# Patient Record
Sex: Female | Born: 1960 | Race: White | Hispanic: No | Marital: Married | Smoking: Former smoker
Health system: Southern US, Community
[De-identification: ages and names within clinical notes are randomized; demographics above are authoritative.]

## PROBLEM LIST (undated history)

## (undated) DIAGNOSIS — E669 Obesity, unspecified: Secondary | ICD-10-CM

## (undated) DIAGNOSIS — Z9289 Personal history of other medical treatment: Secondary | ICD-10-CM

## (undated) DIAGNOSIS — L989 Disorder of the skin and subcutaneous tissue, unspecified: Secondary | ICD-10-CM

## (undated) DIAGNOSIS — F419 Anxiety disorder, unspecified: Secondary | ICD-10-CM

## (undated) DIAGNOSIS — R7302 Impaired glucose tolerance (oral): Secondary | ICD-10-CM

## (undated) DIAGNOSIS — F410 Panic disorder [episodic paroxysmal anxiety] without agoraphobia: Secondary | ICD-10-CM

## (undated) DIAGNOSIS — D219 Benign neoplasm of connective and other soft tissue, unspecified: Secondary | ICD-10-CM

## (undated) HISTORY — DX: Obesity, unspecified: E66.9

## (undated) HISTORY — PX: LIPOMA EXCISION: SHX5283

## (undated) HISTORY — DX: Personal history of other medical treatment: Z92.89

## (undated) HISTORY — DX: Panic disorder (episodic paroxysmal anxiety): F41.0

## (undated) HISTORY — PX: CHOLECYSTECTOMY: SHX55

## (undated) HISTORY — PX: TONSILLECTOMY: SUR1361

## (undated) HISTORY — DX: Impaired glucose tolerance (oral): R73.02

## (undated) HISTORY — DX: Benign neoplasm of connective and other soft tissue, unspecified: D21.9

## (undated) HISTORY — DX: Disorder of the skin and subcutaneous tissue, unspecified: L98.9

## (undated) HISTORY — DX: Anxiety disorder, unspecified: F41.9

---

## 2007-07-17 ENCOUNTER — Ambulatory Visit: Payer: Self-pay | Admitting: Family Medicine

## 2008-09-24 ENCOUNTER — Ambulatory Visit: Payer: Self-pay | Admitting: Family Medicine

## 2009-02-24 ENCOUNTER — Ambulatory Visit: Payer: Self-pay | Admitting: Family Medicine

## 2009-09-24 ENCOUNTER — Ambulatory Visit: Payer: Self-pay | Admitting: Gastroenterology

## 2010-02-17 ENCOUNTER — Ambulatory Visit: Payer: Self-pay | Admitting: Family Medicine

## 2010-03-03 ENCOUNTER — Ambulatory Visit: Payer: Self-pay | Admitting: Family Medicine

## 2010-04-12 ENCOUNTER — Ambulatory Visit: Payer: Self-pay | Admitting: Family Medicine

## 2010-04-21 ENCOUNTER — Ambulatory Visit: Payer: Self-pay | Admitting: Otolaryngology

## 2014-01-04 LAB — HM PAP SMEAR

## 2014-01-18 LAB — HM MAMMOGRAPHY

## 2015-06-11 ENCOUNTER — Other Ambulatory Visit: Payer: Self-pay | Admitting: Adult Health

## 2015-06-11 DIAGNOSIS — R0989 Other specified symptoms and signs involving the circulatory and respiratory systems: Secondary | ICD-10-CM

## 2015-06-13 ENCOUNTER — Emergency Department: Payer: Managed Care, Other (non HMO)

## 2015-06-13 ENCOUNTER — Emergency Department
Admission: EM | Admit: 2015-06-13 | Discharge: 2015-06-13 | Disposition: A | Discharge status: Home / Self Care | Payer: Managed Care, Other (non HMO) | Attending: Emergency Medicine | Admitting: Emergency Medicine

## 2015-06-13 ENCOUNTER — Encounter: Payer: Self-pay | Admitting: Emergency Medicine

## 2015-06-13 DIAGNOSIS — F419 Anxiety disorder, unspecified: Secondary | ICD-10-CM

## 2015-06-13 DIAGNOSIS — Z87891 Personal history of nicotine dependence: Secondary | ICD-10-CM | POA: Insufficient documentation

## 2015-06-13 DIAGNOSIS — Z880 Allergy status to penicillin: Secondary | ICD-10-CM | POA: Insufficient documentation

## 2015-06-13 DIAGNOSIS — R202 Paresthesia of skin: Secondary | ICD-10-CM | POA: Diagnosis present

## 2015-06-13 LAB — BASIC METABOLIC PANEL
Anion gap: 6 (ref 5–15)
BUN: 18 mg/dL (ref 6–20)
CALCIUM: 8.8 mg/dL — AB (ref 8.9–10.3)
CO2: 22 mmol/L (ref 22–32)
CREATININE: 0.88 mg/dL (ref 0.44–1.00)
Chloride: 109 mmol/L (ref 101–111)
GFR calc Af Amer: >60 mL/min (ref >60)
GLUCOSE: 103 mg/dL — AB (ref 65–99)
POTASSIUM: 3.6 mmol/L (ref 3.5–5.1)
SODIUM: 137 mmol/L (ref 135–145)

## 2015-06-13 LAB — CBC
HEMATOCRIT: 42.4 % (ref 35.0–47.0)
Hemoglobin: 14.2 g/dL (ref 12.0–16.0)
MCH: 28.8 pg (ref 26.0–34.0)
MCHC: 33.5 g/dL (ref 32.0–36.0)
MCV: 86 fL (ref 80.0–100.0)
PLATELETS: 241 10*3/uL (ref 150–440)
RBC: 4.93 MIL/uL (ref 3.80–5.20)
RDW: 12.9 % (ref 11.5–14.5)
WBC: 7 10*3/uL (ref 3.6–11.0)

## 2015-06-13 LAB — TROPONIN I: Troponin I: <0.03 ng/mL (ref <0.031)

## 2015-06-13 NOTE — ED Provider Notes (Signed)
[facility] Emergency Department Provider Note  ____________________________________________  Time seen: Approximately 2:52 PM  I have reviewed the triage vital signs and the nursing notes.   HISTORY  Chief Complaint Tingling    HPI Shelly Underwood is a 54 y.o. female  patient reports she has panic attacks. Patient reports she's had difficulty doing air into her throatand her throat feels like it's closing this is been going on intermittently for quite some time but only for the last 2 weeks she's had trouble with it. She also says she's having a little bit of funny feeling when she swallows. She is scheduled to have a swallowing study next week. Patient reports she used to smoke as a teenager for several years. She drinks occasionally perhaps 2 or 3 times a week. Patient denies having any fever. She does have a dry cough. She is not short of breath at at all. She does have occasional sharp stabbing chest pains that come last a second or less and then go away. She's had these in various spots in her chest and also in her upper abdomen.  History reviewed. No pertinent past medical history.  There are no active problems to display for this patient.   Past Surgical History  Procedure Laterality Date  . Cholecystectomy    . Tonsillectomy      No current outpatient prescriptions on file.  Allergies Erythromycin and Penicillins  No family history on file.  Social History Social History  Substance Use Topics  . Smoking status: Former Research scientist (life sciences)  . Smokeless tobacco: None  . Alcohol Use: Yes    Review of Systems Constitutional: No fever/chills Eyes: No visual changes. ENT: No sore throat. Cardiovascular: Denies chest pain. Respiratory: Denies shortness of breath. Gastrointestinal: No abdominal pain.  No nausea, no vomiting.  No diarrhea.  No constipation. Genitourinary: Negative for dysuria. Musculoskeletal: Negative for back pain. Skin: Negative for  rash. Neurological: Negative for headaches, focal weakness or numbness.  10-point ROS otherwise negative.  ____________________________________________   PHYSICAL EXAM:  VITAL SIGNS: ED Triage Vitals  Enc Vitals Group     BP 06/13/15 0932 127/71 mmHg     Pulse Rate 06/13/15 0932 68     Resp 06/13/15 0932 20     Temp 06/13/15 0932 97.7 F (36.5 C)     Temp Source 06/13/15 0932 Oral     SpO2 06/13/15 0932 99 %     Weight 06/13/15 0932 208 lb (94.348 kg)     Height 06/13/15 0932 5\' 7"  (1.702 m)     Head Cir --      Peak Flow --      Pain Score 06/13/15 0933 3     Pain Loc --      Pain Edu? --      Excl. in [place]? --     Constitutional: Alert and oriented. Well appearing and in no acute distress. Eyes: Conjunctivae are normal. PERRL. EOMI. Head: Atraumatic. Nose: No congestion/rhinnorhea. Mouth/Throat: Mucous membranes are moist.  Oropharynx non-erythematous. Neck: No stridor. Cardiovascular: Normal rate, regular rhythm. Grossly normal heart sounds.  Good peripheral circulation. Respiratory: Normal respiratory effort.  No retractions. Lungs CTAB. Gastrointestinal: Soft and nontender. No distention. No abdominal bruits. No CVA tenderness. Musculoskeletal: No lower extremity tenderness nor edema.  No joint effusions. Neurologic:  Normal speech and language. No gross focal neurologic deficits are appreciated. No gait instability. Skin:  Skin is warm, dry and intact. No rash noted. Psychiatric: Mood and affect are normal.  Speech and behavior are normal.  ____________________________________________   LABS (all labs ordered are listed, but only abnormal results are displayed)  Labs Reviewed  BASIC METABOLIC PANEL - Abnormal; Notable for the following:    Glucose, Bld 103 (*)    Calcium 8.8 (*)    All other components within normal limits  CBC  TROPONIN I   ____________________________________________  EKG  EKG read and interpreted by me shows normal sinus rhythm rate  of 64 normal axis normal EKG. ____________________________________________  RADIOLOGY  soft tissue of the neck read by radiologist as no acute disease. ____________________________________________   PROCEDURES   ____________________________________________   INITIAL IMPRESSION / ASSESSMENT AND PLAN / ED COURSE  Pertinent labs & imaging results that were available during my care of the patient were reviewed by me and considered in my medical decision making (see chart for details).   ____________________________________________   FINAL CLINICAL IMPRESSION(S) / ED DIAGNOSES  Final diagnoses:  Anxiety      Nena Polio, MD 06/13/15 1538

## 2015-06-13 NOTE — ED Notes (Signed)
Pt reports has had issues with tingling for a while. Reports takes xanax intermittently, took one last pm with no relief. Pt reports sx's worse for the past 2 weeks. Pt reports this am was headed to work and started with severe tingling in her hands and thighs and feels like her throat is going to close.

## 2015-06-13 NOTE — Discharge Instructions (Signed)
Panic Attacks Panic attacks are sudden, short feelings of great fear or discomfort. You may have them for no reason when you are relaxed, when you are uneasy (anxious), or when you are sleeping.  HOME CARE  Take all your medicines as told.  Check with your doctor before starting new medicines.  Keep all doctor visits. GET HELP IF:  You are not able to take your medicines as told.  Your symptoms do not get better.  Your symptoms get worse. GET HELP RIGHT AWAY IF:  Your attacks seem different than your normal attacks.  You have thoughts about hurting yourself or others.  You take panic attack medicine and you have a side effect. MAKE SURE YOU:  Understand these instructions.  Will watch your condition.  Will get help right away if you are not doing well or get worse. Document Released: 10/02/2010 Document Revised: 06/20/2013 Document Reviewed: 04/13/2013 [facility] Patient Information 2015 [place], [place]. This information is not intended to replace advice given to you by your health care provider. Make sure you discuss any questions you have with your health care provider.  Please keep the appointment for the barium swallow. I would also recommend following up with Dr. Aundra Dubin ear nose and throat doctor he can check on your feeling of something in your throat. He can also check on the nasal polyp that showed up on the CAT scan. Please return for any further problems or worsening. Please call [phone] if she feel very short of breath. Please also follow-up with your doctor about the sharp chest pains which are having.

## 2015-06-13 NOTE — ED Notes (Signed)
Pt reports to RN that she has had an extremely high amount of stress in her family lately. Pt also reports to RN that she intermittently gets sharp pains in her center chest. Denies pain currently.

## 2015-06-18 ENCOUNTER — Ambulatory Visit
Admission: RE | Admit: 2015-06-18 | Discharge: 2015-06-18 | Disposition: A | Discharge status: Home / Self Care | Payer: Managed Care, Other (non HMO) | Source: Ambulatory Visit | Attending: Family Medicine | Admitting: Family Medicine

## 2015-06-18 DIAGNOSIS — K449 Diaphragmatic hernia without obstruction or gangrene: Secondary | ICD-10-CM | POA: Insufficient documentation

## 2015-06-18 DIAGNOSIS — F458 Other somatoform disorders: Secondary | ICD-10-CM | POA: Insufficient documentation

## 2015-06-18 DIAGNOSIS — R0989 Other specified symptoms and signs involving the circulatory and respiratory systems: Secondary | ICD-10-CM

## 2016-09-13 DIAGNOSIS — D219 Benign neoplasm of connective and other soft tissue, unspecified: Secondary | ICD-10-CM

## 2016-09-13 HISTORY — DX: Benign neoplasm of connective and other soft tissue, unspecified: D21.9

## 2016-09-25 IMAGING — CT CT HEAD W/O CM
1 series · 15 of 30 positions shown, 19 images · non-contrast
Comparison: Brain MRI April 12, 2010

CLINICAL DATA: Upper extremity tingling for several weeks.
Dysphagia.

EXAM:
CT HEAD WITHOUT CONTRAST
TECHNIQUE: Contiguous axial images were obtained from the base of the skull
through the vertex without intravenous contrast.

[Series 2: head wo · axial · 0.41mm/px · z∈[+39,+187]mm · 15 of 36 slices shown, 19 images]
[im 2/36  brain]
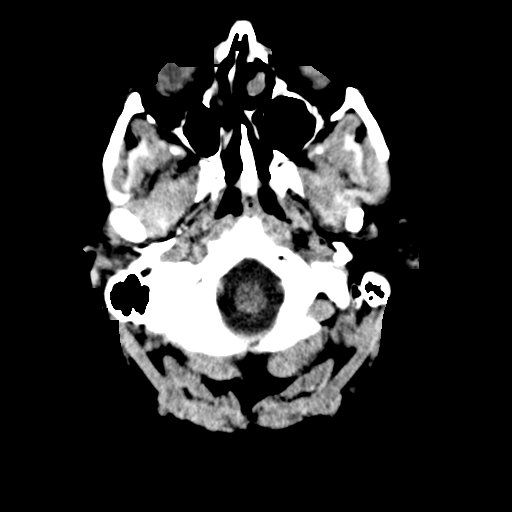
[im 2/36  bone]
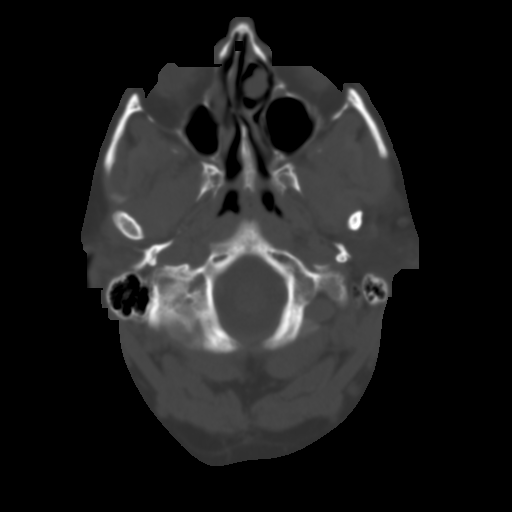
[im 4/36  brain]
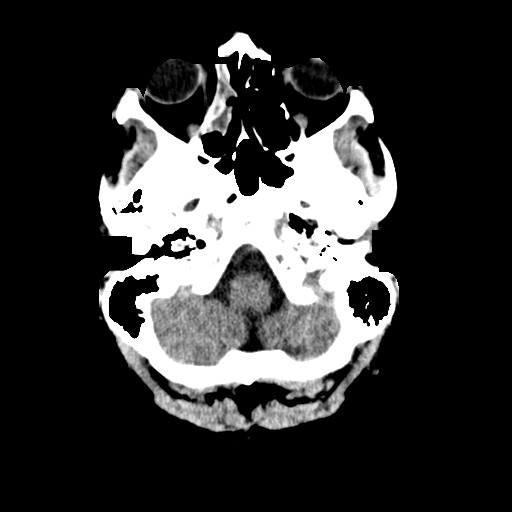
[im 7/36  brain]
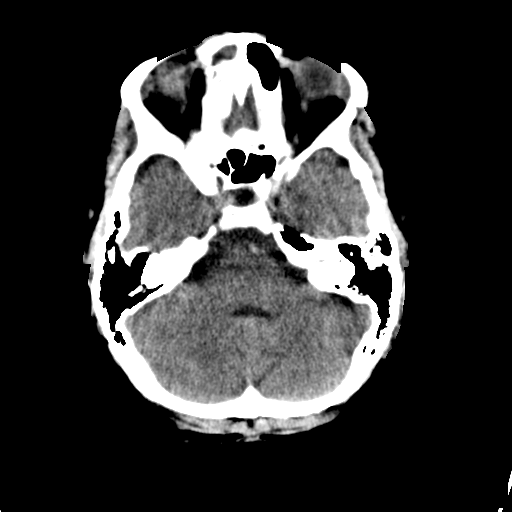
[im 9/36  brain]
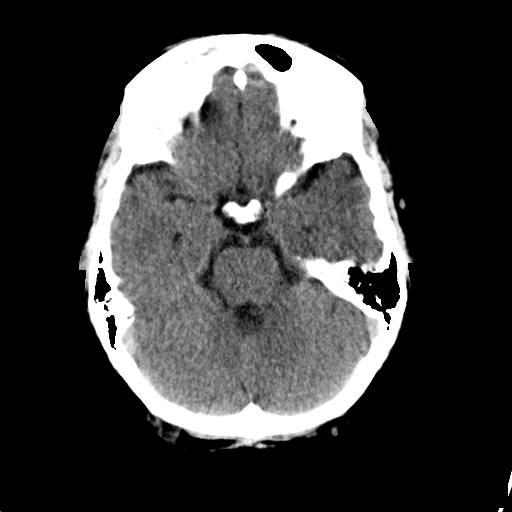
[im 11/36  brain]
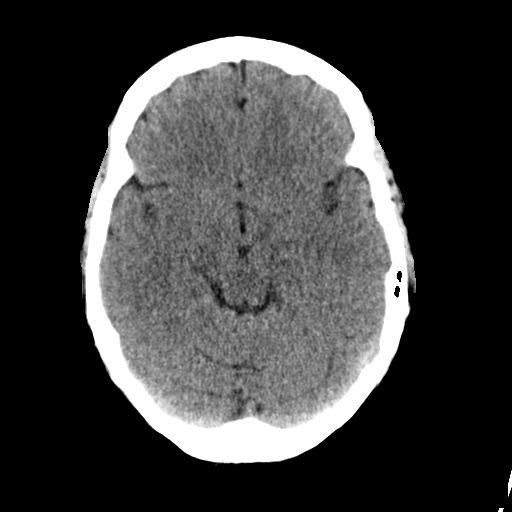
[im 11/36  bone]
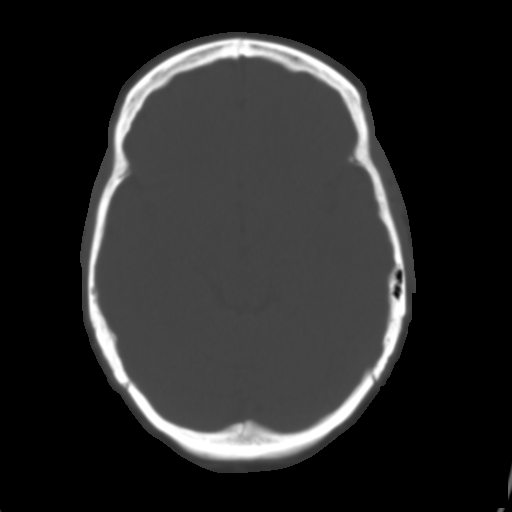
[im 14/36  brain]
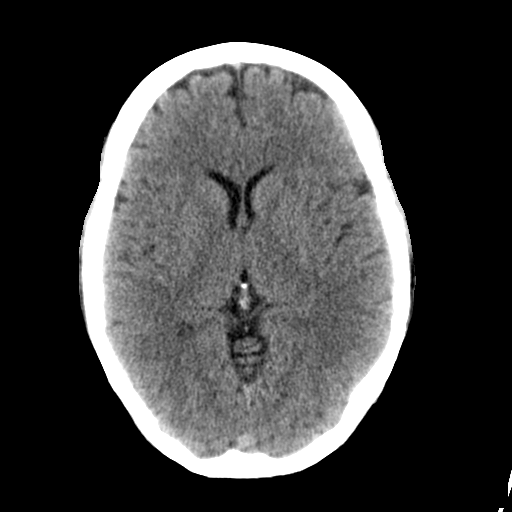
[im 16/36  brain]
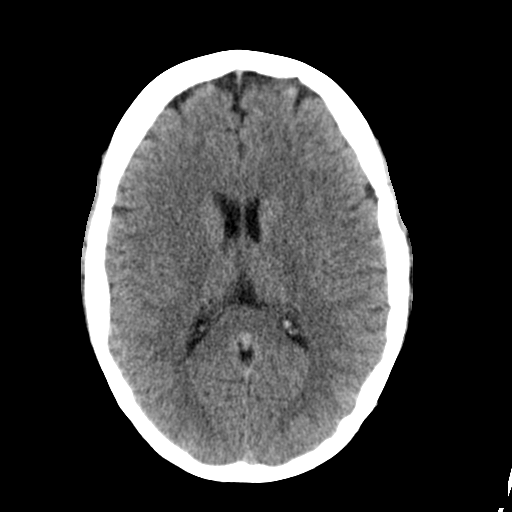
[im 19/36  brain]
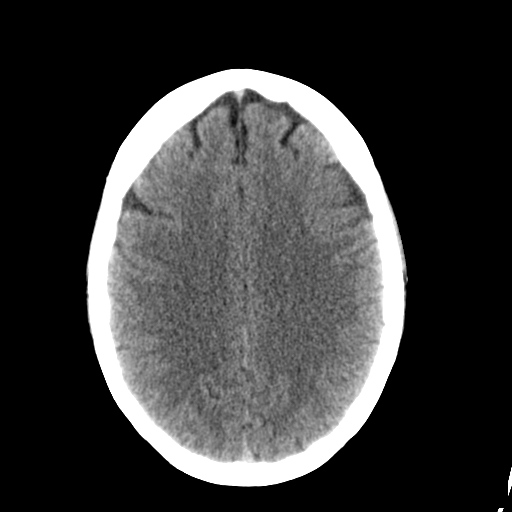
[im 20/36  brain]
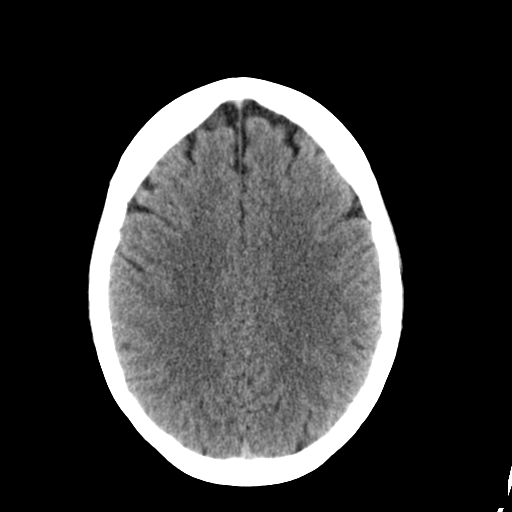
[im 20/36  bone]
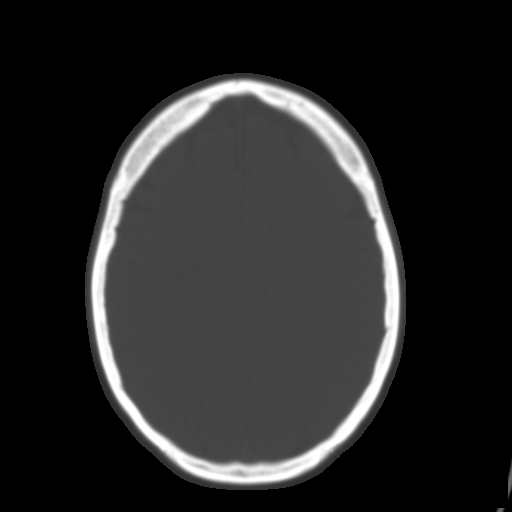
[im 22/36  brain]
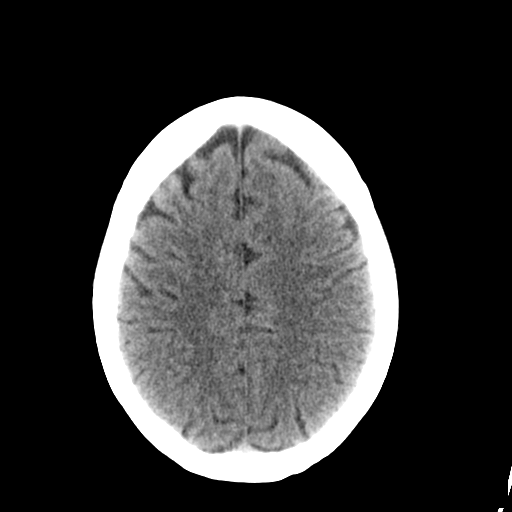
[im 25/36  brain]
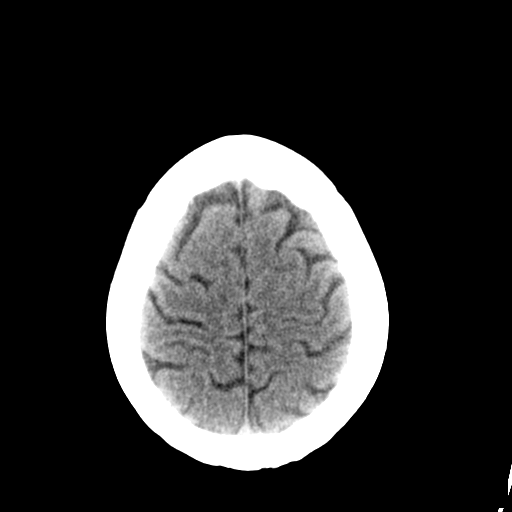
[im 27/36  brain]
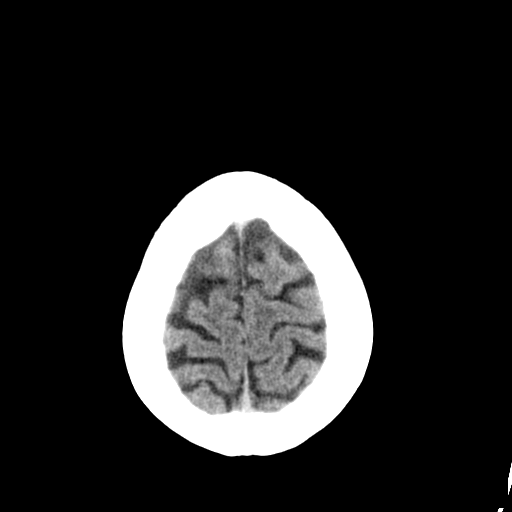
[im 29/36  brain]
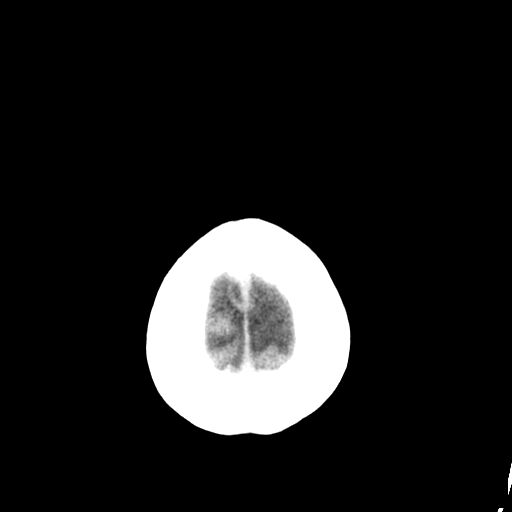
[im 29/36  bone]
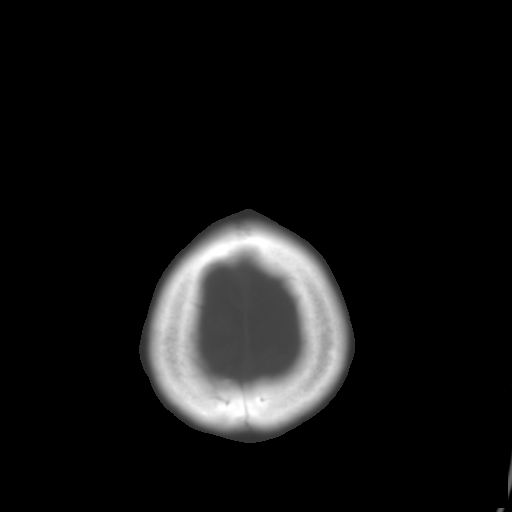
[im 32/36  brain]
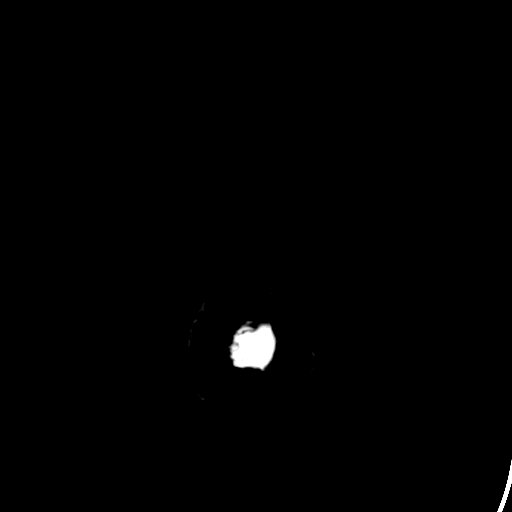
[im 34/36  brain]
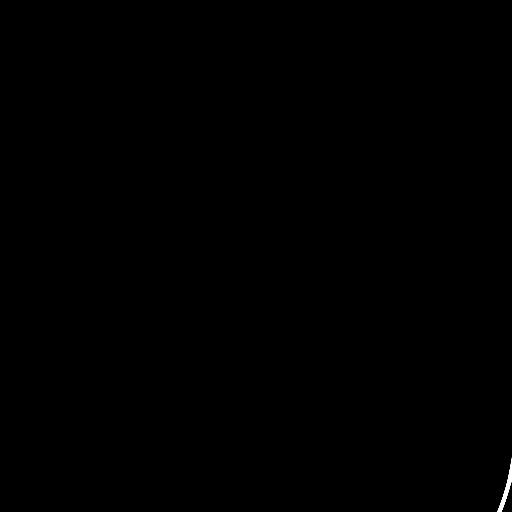

[15 of 30 positions shown; findings below may reference images not displayed]

FINDINGS: The ventricles are normal in size and configuration. There is no
intracranial mass, hemorrhage, extra-axial fluid collection, or
midline shift. Gray-white compartments are normal. There is no acute
infarct evident. The bony calvarium appears intact. The visualized
mastoid air cells are clear. There is opacification of multiple
ethmoid air cells on the right. There is opacification of the right
frontal sinus. There is polypoid formation in a left concha bullosa.
There is rightward deviation of the nasal septum. No intraorbital
lesions are appreciable on this study.
IMPRESSION: No intracranial mass, hemorrhage, or focal gray - white compartment
lesions/acute appearing infarct. Areas of paranasal sinus disease.
Polyp in a concha bullosa on the left. Rightward deviation of nasal
septum.

## 2016-09-25 IMAGING — CR DG NECK SOFT TISSUE
1 series · 2 of 2 positions shown · non-contrast
Comparison: No similar prior exam is available at this institution
for comparison or on [HOSPITAL] PACS.

CLINICAL DATA: Foreign body sensation in the throat

EXAM:
NECK SOFT TISSUES - 1+ VIEW

[Series 1: dg neck soft tissue · 0.14mm/px · 2 of 2 slices shown]
[im 1/2]
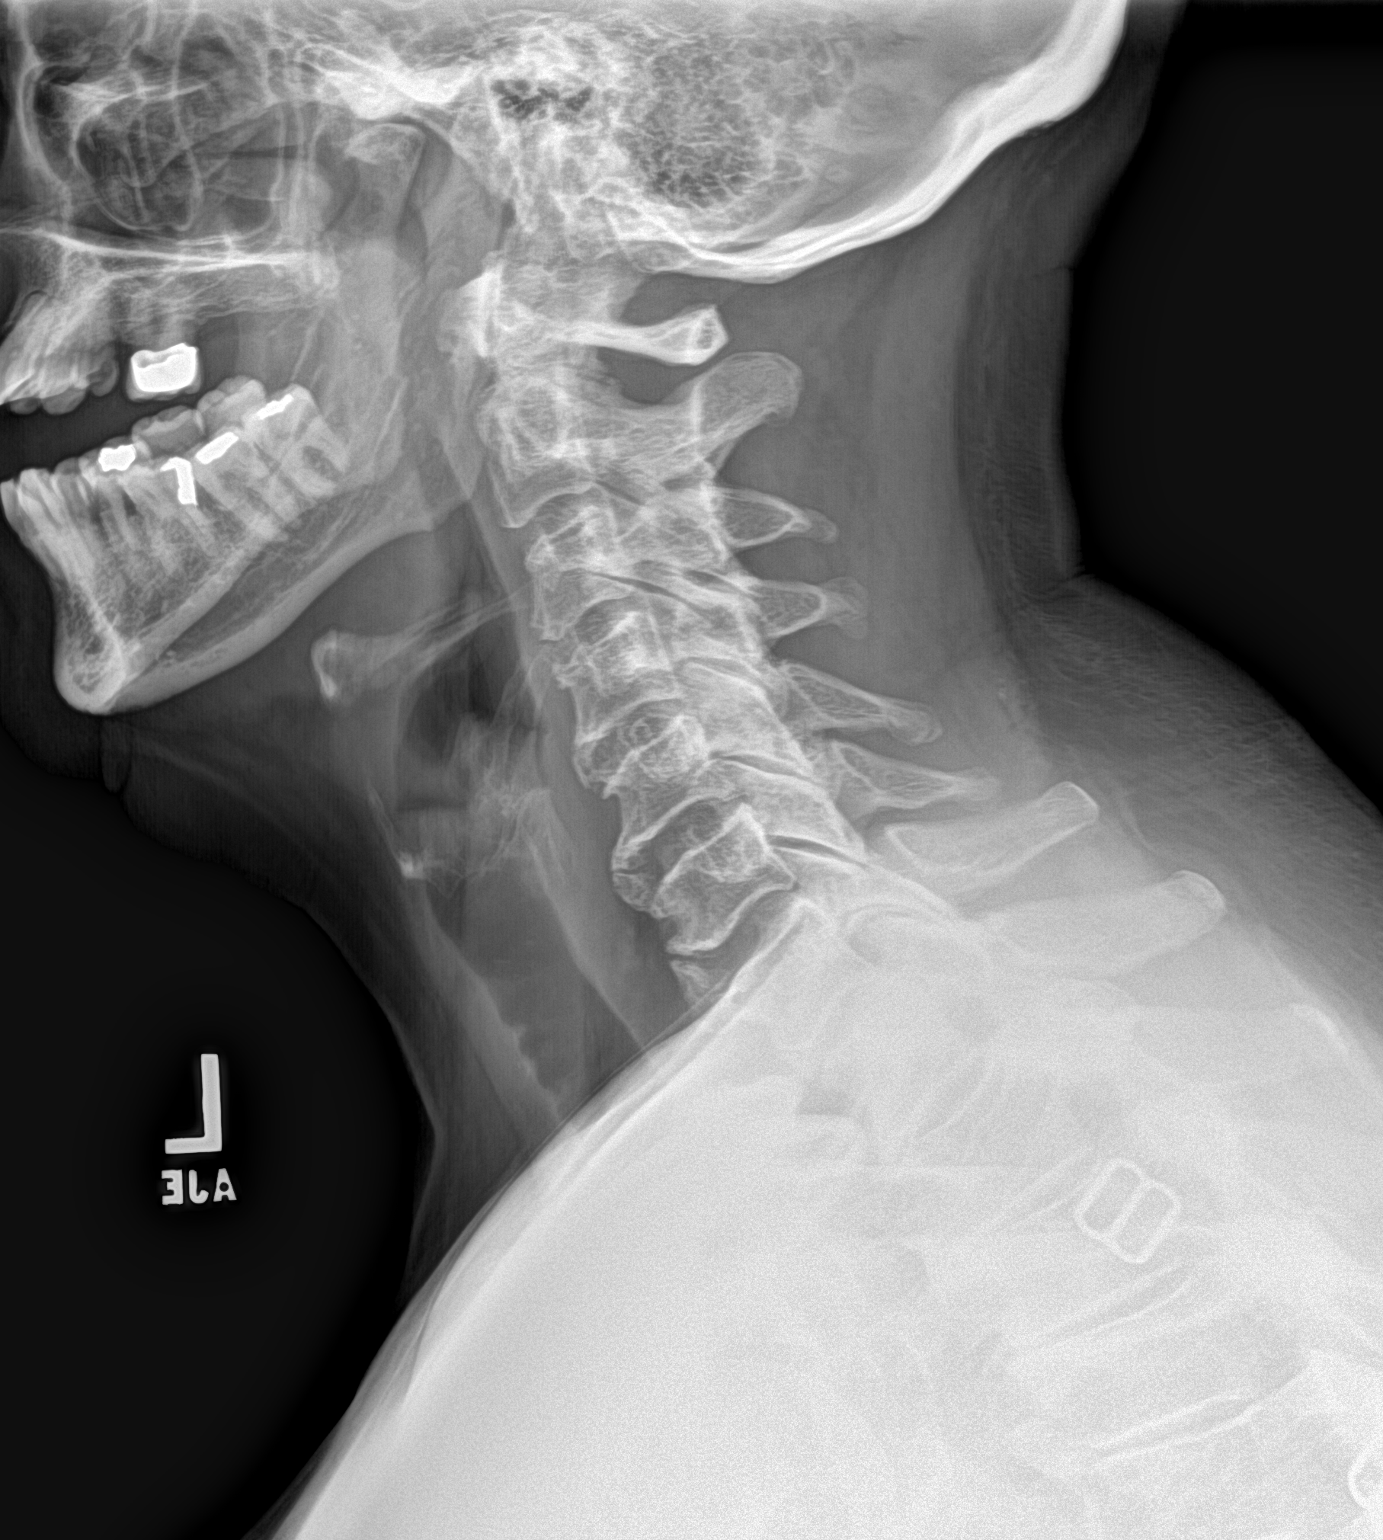
[im 2/2]
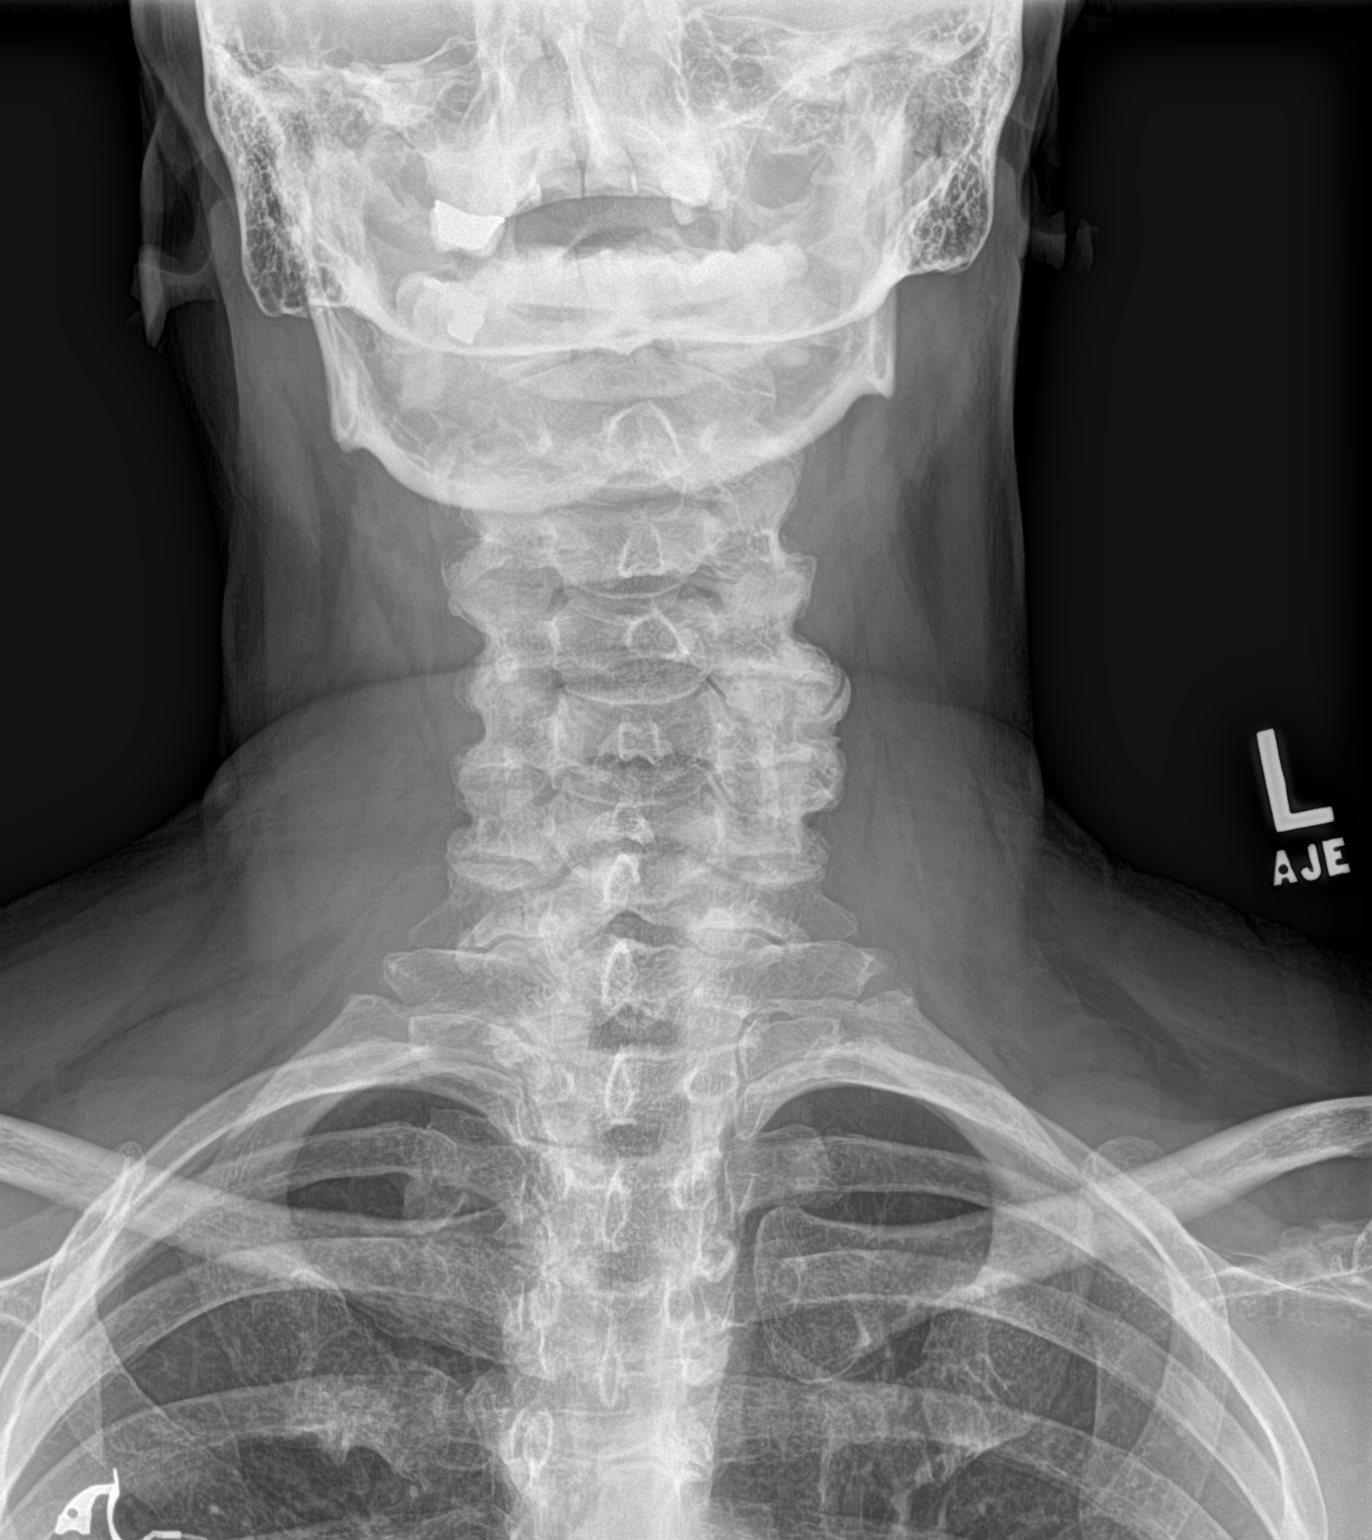

[2 of 2 positions shown; findings below may reference images not displayed]

FINDINGS: There is no evidence of retropharyngeal soft tissue swelling or
epiglottic enlargement. The cervical airway is unremarkable and no
radio-opaque foreign body identified. Multilevel moderate mid left
osteoarthritic facet change noted.
IMPRESSION: Negative.

## 2017-02-22 ENCOUNTER — Ambulatory Visit (INDEPENDENT_AMBULATORY_CARE_PROVIDER_SITE_OTHER): Payer: Managed Care, Other (non HMO) | Admitting: Obstetrics and Gynecology

## 2017-02-22 ENCOUNTER — Encounter: Payer: Self-pay | Admitting: Obstetrics and Gynecology

## 2017-02-22 ENCOUNTER — Ambulatory Visit (INDEPENDENT_AMBULATORY_CARE_PROVIDER_SITE_OTHER): Payer: Managed Care, Other (non HMO)

## 2017-02-22 VITALS — BP 126/74 | Ht 66.0 in | Wt 218.0 lb

## 2017-02-22 DIAGNOSIS — N938 Other specified abnormal uterine and vaginal bleeding: Secondary | ICD-10-CM

## 2017-02-22 DIAGNOSIS — Z124 Encounter for screening for malignant neoplasm of cervix: Secondary | ICD-10-CM

## 2017-02-22 DIAGNOSIS — Z01419 Encounter for gynecological examination (general) (routine) without abnormal findings: Secondary | ICD-10-CM | POA: Diagnosis not present

## 2017-02-22 DIAGNOSIS — Z30431 Encounter for routine checking of intrauterine contraceptive device: Secondary | ICD-10-CM

## 2017-02-22 DIAGNOSIS — T8332XA Displacement of intrauterine contraceptive device, initial encounter: Secondary | ICD-10-CM

## 2017-02-22 DIAGNOSIS — Z1239 Encounter for other screening for malignant neoplasm of breast: Secondary | ICD-10-CM

## 2017-02-22 DIAGNOSIS — Z1231 Encounter for screening mammogram for malignant neoplasm of breast: Secondary | ICD-10-CM | POA: Diagnosis not present

## 2017-02-22 DIAGNOSIS — Z1151 Encounter for screening for human papillomavirus (HPV): Secondary | ICD-10-CM | POA: Diagnosis not present

## 2017-02-22 MED ORDER — MISOPROSTOL 100 MCG PO TABS: ORAL_TABLET | ORAL | 0 refills | Status: DC

## 2017-02-22 NOTE — Progress Notes (Signed)
Chief Complaint  Patient presents with  . Annual Exam    HPI:      [name] is a 56 y.o. No obstetric history on file. who LMP was Patient's last menstrual period was 01/28/2017., presents today for her annual examination.  Her menses are Q1-2 months, lasting 3-4 days. Dysmenorrhea mild, occurring first 1-2 days of flow. She does have intermenstrual bleeding, sometimes dark brown d/c all month, occas real bleeding mid cycle. She has a Mirena IUD, placed 05/01/10. She does not want another IUD after removal.   Sex activity: not sexually active. She does not have vaginal dryness.  Last Pap: January 04, 2014  Results were: no abnormalities /neg HPV DNA.  Hx of STDs: none  Last mammogram: not recent There is a FH of breast cancer in her mat aunt, genetic testing not indicated. There is no FH of ovarian cancer. The patient does not do self-breast exams.  Colonoscopy: colonoscopy 1 years ago without abnormalities.  Tobacco use: The patient denies current or previous tobacco use. Alcohol use: social drinker Exercise: moderately active  She does not get adequate calcium and Vitamin D in her diet. She has a PCP. She is seeing derm for a pruritic rash/hives.  Past Medical History:  Diagnosis Date  . Anxiety   . History of mammogram 08/25/2012; 01/13/14   cat 1; neg  . History of Papanicolaou smear of cervix 03/2010;01/04/2014   -/-; -/-  . Impaired glucose tolerance   . Obesity   . Panic disorder   . Skin disorder     Past Surgical History:  Procedure Laterality Date  . CHOLECYSTECTOMY    . LIPOMA EXCISION    . TONSILLECTOMY      Family History  Problem Relation Age of Onset  . Hyperlipidemia Mother   . Hypertension Maternal Grandmother   . Hypertension Maternal Grandfather   . Breast cancer Maternal Aunt 50    Social History   Social History  . Marital status: Married    Spouse name: N/A  . Number of children: N/A  . Years of education: 60   Occupational  History  . Not on file.   Social History Main Topics  . Smoking status: Former Research scientist (life sciences)  . Smokeless tobacco: Never Used  . Alcohol use Yes     Comment: occ  . Drug use: No  . Sexual activity: Not Currently   Other Topics Concern  . Not on file   Social History Narrative  . No narrative on file     Current Outpatient Prescriptions:  .  ALPRAZolam (XANAX) 0.5 MG tablet, Take by mouth., Disp: , Rfl:  .  hydrOXYzine (ATARAX/VISTARIL) 25 MG tablet, , Disp: , Rfl:  .  triamcinolone cream (KENALOG) 0.1 %, Apply topically., Disp: , Rfl:  .  misoprostol (CYTOTEC) 100 MCG tablet, Take 1 tablet the night before appt, Disp: 1 tablet, Rfl: 0   ROS:  Review of Systems  Constitutional: Negative for fatigue, fever and unexpected weight change.  Respiratory: Negative for cough, shortness of breath and wheezing.   Cardiovascular: Negative for chest pain, palpitations and leg swelling.  Gastrointestinal: Positive for constipation. Negative for blood in stool, diarrhea, nausea and vomiting.  Endocrine: Negative for cold intolerance, heat intolerance and polyuria.  Genitourinary: Negative for dyspareunia, dysuria, flank pain, frequency, genital sores, hematuria, menstrual problem, pelvic pain, urgency, vaginal bleeding, vaginal discharge and vaginal pain.  Musculoskeletal: Positive for arthralgias and joint swelling. Negative for back pain and myalgias.  Skin: Positive for color change and rash.  Neurological: Positive for headaches. Negative for dizziness, syncope, light-headedness and numbness.  Hematological: Negative for adenopathy.  Psychiatric/Behavioral: Negative for agitation, confusion, sleep disturbance and suicidal ideas. The patient is nervous/anxious.      Objective: BP 126/74   Ht 5\' 6"  (1.676 m)   Wt 218 lb (98.9 kg)   LMP 01/28/2017   BMI 35.19 kg/m    Physical Exam  Constitutional: She is oriented to person, place, and time. She appears well-developed and  well-nourished.  Genitourinary: Vagina normal and uterus normal. There is no rash or tenderness on the right labia. There is no rash or tenderness on the left labia. No erythema or tenderness in the vagina. No vaginal discharge found. Right adnexum does not display mass and does not display tenderness. Left adnexum does not display mass and does not display tenderness. Cervix does not exhibit motion tenderness, polyp or visible IUD strings. Uterus is not enlarged or tender.  Genitourinary Comments: IUD STRINGS NOT SEEN/PALPATED  Neck: Normal range of motion. No thyromegaly present.  Cardiovascular: Normal rate, regular rhythm and normal heart sounds.   No murmur heard. Pulmonary/Chest: Effort normal and breath sounds normal. Right breast exhibits no mass, no nipple discharge, no skin change and no tenderness. Left breast exhibits no mass, no nipple discharge, no skin change and no tenderness.  Abdominal: Soft. There is no tenderness. There is no guarding.  Musculoskeletal: Normal range of motion.  Neurological: She is alert and oriented to person, place, and time. No cranial nerve deficit.  Psychiatric: She has a normal mood and affect. Her behavior is normal.  Vitals reviewed.   Results: GYN U/S-->EM=7.1 MM; IUD IN LUS; 2 LEIO--1 SUBMUCOSAL, 1 PEDUNCULATED; RTO NOT VISUALIZED; LTO WITH FOLLICLES  Assessment/Plan:  Encounter for annual routine gynecological examination  Cervical cancer screening - Plan: IGP, Aptima HPV  Screening for HPV (human papillomavirus) - Plan: IGP, Aptima HPV  Screening for breast cancer - Pt to sched mammo. - Plan: MM DIGITAL SCREENING BILATERAL  Encounter for routine checking of intrauterine contraceptive device (IUD) - IUD strings not visible. In LUS per u/s.  Will refer to MD for removal/Cytotec night before due to stenotic os. - Plan: US Transvaginal Non-OB, misoprostol (CYTOTEC) 100 MCG tablet  DUB (dysfunctional uterine bleeding) - Per u/s, IUD in LUS, 2  leio. RTO with MD for IUD removal. Rx cytotec. See if sx improve with IUD removal. If not, will eval further.  - Plan: US Transvaginal Non-OB          GYN counsel mammography screening, menopause     F/U  Return in about 1 week (around 03/01/2017) for with MD for IUD rem.  Alicia B. Copland, PA-C 02/22/2017 5:08 PM

## 2017-02-22 NOTE — Progress Notes (Signed)
Review of ULTRASOUND.    I have personally reviewed images and report of recent ultrasound done at [facility].    Plan of management to be discussed with patient.  As discussed.  Remove IUD and consider options such as hysteroscopy, even hysterectomy.  Barnett Applebaum, MD, [facility], [facility] 02/22/2017  4:19 PM

## 2017-02-24 LAB — IGP, APTIMA HPV
HPV APTIMA: NEGATIVE
PAP Smear Comment: 0

## 2017-03-01 ENCOUNTER — Ambulatory Visit (INDEPENDENT_AMBULATORY_CARE_PROVIDER_SITE_OTHER): Payer: Managed Care, Other (non HMO) | Admitting: Obstetrics & Gynecology

## 2017-03-01 ENCOUNTER — Encounter: Payer: Self-pay | Admitting: Obstetrics & Gynecology

## 2017-03-01 VITALS — BP 138/80 | HR 83 | Ht 67.0 in | Wt 218.0 lb

## 2017-03-01 DIAGNOSIS — R87610 Atypical squamous cells of undetermined significance on cytologic smear of cervix (ASC-US): Secondary | ICD-10-CM | POA: Diagnosis not present

## 2017-03-01 DIAGNOSIS — N938 Other specified abnormal uterine and vaginal bleeding: Secondary | ICD-10-CM | POA: Diagnosis not present

## 2017-03-01 NOTE — Progress Notes (Signed)
  History of Present Illness:  Shelly Underwood is a 56 y.o. that had a Mirena IUD placed approximately 7 years ago. Since that time, she states that she lost track of time for its removal.  Recent change in bleeding pattern (still has monthly one day periods w min pain.)  Occas recent BTB.  Recent US showed IUD in LUS.  No strings on exam.  The following portions of the patient's history were reviewed and updated as appropriate: allergies, current medications, past family history, past medical history, past social history, past surgical history and problem list.  Patient Active Problem List   Diagnosis Date Noted  . Dysfunctional uterine bleeding 03/01/2017  . ASCUS of cervix with negative high risk HPV 03/01/2017   Medications:  Current Outpatient Prescriptions on File Prior to Visit  Medication Sig Dispense Refill  . ALPRAZolam (XANAX) 0.5 MG tablet Take by mouth.    . hydrOXYzine (ATARAX/VISTARIL) 25 MG tablet     . misoprostol (CYTOTEC) 100 MCG tablet Take 1 tablet the night before appt 1 tablet 0  . triamcinolone cream (KENALOG) 0.1 % Apply topically.     No current facility-administered medications on file prior to visit.    Allergies: is allergic to erythromycin and penicillins.  Physical Exam:  BP 138/80   Pulse 83   Ht 5\' 7"  (1.702 m)   Wt 218 lb (98.9 kg)   BMI 34.14 kg/m  Body mass index is 34.14 kg/m. Constitutional: Well nourished, well developed female in no acute distress.  Abdomen: diffusely non tender to palpation, non distended, and no masses, hernias Neuro: Grossly intact Psych:  Normal mood and affect.    Pelvic exam:  Two IUD strings absent seen coming from the cervical os. EGBUS, vaginal vault and cervix: within normal limits  IUD Removal Cervix dilated with mechanical dilators (cytotec last night too).  Strings of IUD identified and grasped from within cervix.  IUD removed without problem.  Pt tolerated this well.  IUD noted to be intact.  Assessment:  IUD Removal and DUB  Plan: IUD removed and plan for contraception is no method. She was amenable to this plan. Monitor for AUB.  Known fibroid that may be submucosal.  Consider Hyst D&C if bleeding problems persist.  EMB if irreg bleeding but does not want surgery.  F/u one month to see.    ASCUS HPV Neg PAP discussed.  F/u PAP one year.  Barnett Applebaum, M.D. 03/01/2017 1:44 PM

## 2017-03-29 ENCOUNTER — Ambulatory Visit (INDEPENDENT_AMBULATORY_CARE_PROVIDER_SITE_OTHER): Payer: Managed Care, Other (non HMO) | Admitting: Obstetrics & Gynecology

## 2017-03-29 ENCOUNTER — Encounter: Payer: Self-pay | Admitting: Obstetrics & Gynecology

## 2017-03-29 VITALS — BP 120/70 | HR 77 | Ht 66.0 in | Wt 219.0 lb

## 2017-03-29 DIAGNOSIS — N938 Other specified abnormal uterine and vaginal bleeding: Secondary | ICD-10-CM

## 2017-03-29 DIAGNOSIS — D250 Submucous leiomyoma of uterus: Secondary | ICD-10-CM

## 2017-03-29 NOTE — Progress Notes (Signed)
  HPI: Pt had irreg bleeding that has improved since IUD of 7 years removed one month ago; also has known small SM Fibroid, and reports min pain from this.  PMHx: She  has a past medical history of Anxiety; History of mammogram (08/25/2012; 01/13/14); History of Papanicolaou smear of cervix (03/2010;01/04/2014); Impaired glucose tolerance; Obesity; Panic disorder; and Skin disorder. Also,  has a past surgical history that includes Cholecystectomy; Tonsillectomy; and Lipoma excision., family history includes Breast cancer (age of onset: 69) in her maternal aunt; Hyperlipidemia in her mother; Hypertension in her maternal grandfather and maternal grandmother.,  reports that she has quit smoking. She has never used smokeless tobacco. She reports that she drinks alcohol. She reports that she does not use drugs.  She has a current medication list which includes the following prescription(s): betamethasone dipropionate, minocycline hcl, alprazolam, hydroxyzine, misoprostol, and triamcinolone cream. Also, is allergic to erythromycin and penicillins.  Review of Systems  All other systems reviewed and are negative.   Objective: BP 120/70   Pulse 77   Ht 5\' 6"  (1.676 m)   Wt 219 lb (99.3 kg)   BMI 35.35 kg/m   Physical examination Constitutional NAD, Conversant  Skin No rashes, lesions or ulceration.   Extremities: Moves all appropriately.  Normal ROM for age. No lymphadenopathy.  Neuro: Grossly intact  Psych: Oriented to PPT.  Normal mood. Normal affect.   Assessment:  Dysfunctional uterine bleeding  Submucous leiomyoma of uterus  Monitor for sx's of bleeding, consider D&C Hyst vs EMB based on sx's moving forward.  A total of 15 minutes were spent face-to-face with the patient during this encounter and over half of that time dealt with counseling and coordination of care.  Barnett Applebaum, MD, [facility], [facility] 03/29/2017  4:40 PM

## 2017-07-27 ENCOUNTER — Encounter: Payer: Self-pay | Admitting: Obstetrics and Gynecology

## 2017-07-27 ENCOUNTER — Ambulatory Visit (INDEPENDENT_AMBULATORY_CARE_PROVIDER_SITE_OTHER): Payer: Managed Care, Other (non HMO) | Admitting: Obstetrics and Gynecology

## 2017-07-27 VITALS — BP 126/88 | Wt 217.0 lb

## 2017-07-27 DIAGNOSIS — N921 Excessive and frequent menstruation with irregular cycle: Secondary | ICD-10-CM | POA: Diagnosis not present

## 2017-07-27 DIAGNOSIS — N951 Menopausal and female climacteric states: Secondary | ICD-10-CM | POA: Diagnosis not present

## 2017-07-27 DIAGNOSIS — F419 Anxiety disorder, unspecified: Secondary | ICD-10-CM

## 2017-07-27 DIAGNOSIS — N938 Other specified abnormal uterine and vaginal bleeding: Secondary | ICD-10-CM | POA: Diagnosis not present

## 2017-07-27 MED ORDER — ALPRAZOLAM 0.5 MG PO TABS: 0.5000 mg | ORAL_TABLET | Freq: Three times a day (TID) | ORAL | 0 refills | Status: DC | PRN

## 2017-07-27 MED ORDER — TRANEXAMIC ACID 650 MG PO TABS: 1300.0000 mg | ORAL_TABLET | Freq: Three times a day (TID) | ORAL | 7 refills | Status: DC

## 2017-07-27 NOTE — Progress Notes (Signed)
Chief Complaint  Patient presents with  . Menstrual Problem    HPI:      [name] is a 56 y.o. [id number] who LMP was Patient's last menstrual period was 07/23/2017., presents today for heavy bleeding sx. Pt had Mirena removed 6/18 that had expired. Pt was having monthly menses with IUD with occas BTB. She also has 2 leio, 1 pedunculated and 1 submucosal on u/s. She was seen by Dr. Kenton Kingfisher 7/18 and he suggested EMB vs hyst/D&C if DUB sx persisted.  Pt states she had a period 8/18 for 2-3 days, heavy flow changing pads Q3 hrs. She was worried about this. Bleeding stopped after 3 days, then recurred 3 wks later. Again, bleeding for 2-3 days with heavier flow. Mild dysmen both times. Pt then had period 07/23/17 that lasted 3-4 days, again heavy but with significant dysmen (worse than previous months), improved somewhat with ibup.  No DUB sx just heavier periods. No hx of migraines with aura/HTN/tobacco use/hx of DVTs. Pt not interested in another IUD.   Pt also with hx of panic attacks/anxiety and takes xanax prn sparingly. In process of finding new PCP since previous one left. Would like Rx RF.   Past Medical History:  Diagnosis Date  . Anxiety   . History of mammogram 08/25/2012; 01/13/14   cat 1; neg  . History of Papanicolaou smear of cervix 03/2010;01/04/2014   -/-; -/-  . Impaired glucose tolerance   . Leiomyoma 2018   submucosal and pedunculated  . Obesity   . Panic disorder   . Skin disorder     Past Surgical History:  Procedure Laterality Date  . CHOLECYSTECTOMY    . LIPOMA EXCISION    . TONSILLECTOMY      Family History  Problem Relation Age of Onset  . Hyperlipidemia Mother   . Hypertension Maternal Grandmother   . Hypertension Maternal Grandfather   . Breast cancer Maternal Aunt 73    Social History   Socioeconomic History  . Marital status: Married    Spouse name: Not on file  . Number of children: Not on file  . Years of education: 89  . Highest  education level: Not on file  Social Needs  . Financial resource strain: Not on file  . Food insecurity - worry: Not on file  . Food insecurity - inability: Not on file  . Transportation needs - medical: Not on file  . Transportation needs - non-medical: Not on file  Occupational History  . Not on file  Tobacco Use  . Smoking status: Former Research scientist (life sciences)  . Smokeless tobacco: Never Used  Substance and Sexual Activity  . Alcohol use: Yes    Comment: occ  . Drug use: No  . Sexual activity: Not Currently  Other Topics Concern  . Not on file  Social History Narrative  . Not on file     Current Outpatient Medications:  .  ALPRAZolam (XANAX) 0.5 MG tablet, Take 1 tablet (0.5 mg total) 3 (three) times daily as needed by mouth for anxiety., Disp: 30 tablet, Rfl: 0 .  Betamethasone Dipropionate (SERNIVO) 0.05 % EMUL, Apply topically., Disp: , Rfl:  .  hydrOXYzine (ATARAX/VISTARIL) 25 MG tablet, , Disp: , Rfl:  .  Minocycline HCl (SOLODYN PO), Take by mouth., Disp: , Rfl:  .  misoprostol (CYTOTEC) 100 MCG tablet, Take 1 tablet the night before appt (Patient not taking: Reported on 07/27/2017), Disp: 1 tablet, Rfl: 0 .  tranexamic acid (LYSTEDA) 650  MG TABS tablet, Take 2 tablets (1,300 mg total) 3 (three) times daily by mouth. Take during menses for a maximum of five days, Disp: 30 tablet, Rfl: 7 .  triamcinolone cream (KENALOG) 0.1 %, Apply topically., Disp: , Rfl:    ROS:  Review of Systems  Constitutional: Negative for fever.  Gastrointestinal: Positive for constipation. Negative for blood in stool, diarrhea, nausea and vomiting.  Genitourinary: Positive for vaginal bleeding. Negative for dyspareunia, dysuria, flank pain, frequency, hematuria, urgency, vaginal discharge and vaginal pain.  Musculoskeletal: Negative for back pain.  Skin: Negative for rash.  Neurological: Positive for headaches.     OBJECTIVE:   Vitals:  BP 126/88   Wt 217 lb (98.4 kg)   LMP 07/23/2017   BMI 35.02  kg/m   Physical Exam  Constitutional: She is oriented to person, place, and time and well-developed, well-nourished, and in no distress.  Neurological: She is alert and oriented to person, place, and time.  Psychiatric: Memory, affect and judgment normal.  Vitals reviewed.   Assessment/Plan: Menorrhagia with irregular cycle - No DUB sx. Discussed mgmt options of doing nothing (flow is WNL), lysteda, OCPs, depo, nexplanon, another IUD, ablation, hyst, D&C. Pt wants to try lysteda. Rx. - Plan: tranexamic acid (LYSTEDA) 650 MG TABS tablet  Anxiety - Rx RF xanax for prn use. Pt uses sparingly. - Plan: ALPRAZolam (XANAX) 0.5 MG tablet  DUB (dysfunctional uterine bleeding) - Sx resolved after IUD removed. F/u prn.   Perimenopause    Meds ordered this encounter  Medications  . tranexamic acid (LYSTEDA) 650 MG TABS tablet    Sig: Take 2 tablets (1,300 mg total) 3 (three) times daily by mouth. Take during menses for a maximum of five days    Dispense:  30 tablet    Refill:  7  . ALPRAZolam (XANAX) 0.5 MG tablet    Sig: Take 1 tablet (0.5 mg total) 3 (three) times daily as needed by mouth for anxiety.    Dispense:  30 tablet    Refill:  0      Return if symptoms worsen or fail to improve.  Katiana Ruland B. Erron Wengert, PA-C 07/27/2017 3:49 PM

## 2017-07-27 NOTE — Patient Instructions (Signed)
I value your feedback and entrusting us with your care. If you get a [facility] patient survey, I would appreciate you taking the time to let us know about your experience today. Thank you! 

## 2018-01-04 ENCOUNTER — Other Ambulatory Visit: Payer: Self-pay | Admitting: Obstetrics and Gynecology

## 2018-01-04 DIAGNOSIS — F419 Anxiety disorder, unspecified: Secondary | ICD-10-CM

## 2018-01-04 NOTE — Telephone Encounter (Signed)
Please advise thank you

## 2018-07-30 ENCOUNTER — Emergency Department: Payer: 59

## 2018-07-30 ENCOUNTER — Encounter: Payer: Self-pay | Admitting: Emergency Medicine

## 2018-07-30 ENCOUNTER — Other Ambulatory Visit: Payer: Self-pay

## 2018-07-30 ENCOUNTER — Emergency Department
Admission: EM | Admit: 2018-07-30 | Discharge: 2018-07-31 | Disposition: A | Discharge status: Home / Self Care | Payer: 59 | Attending: Emergency Medicine | Admitting: Emergency Medicine

## 2018-07-30 DIAGNOSIS — Z87891 Personal history of nicotine dependence: Secondary | ICD-10-CM | POA: Diagnosis not present

## 2018-07-30 DIAGNOSIS — D509 Iron deficiency anemia, unspecified: Secondary | ICD-10-CM | POA: Diagnosis not present

## 2018-07-30 DIAGNOSIS — Z79899 Other long term (current) drug therapy: Secondary | ICD-10-CM | POA: Diagnosis not present

## 2018-07-30 DIAGNOSIS — R079 Chest pain, unspecified: Secondary | ICD-10-CM | POA: Diagnosis not present

## 2018-07-30 LAB — BASIC METABOLIC PANEL
Anion gap: 7 (ref 5–15)
BUN: 14 mg/dL (ref 6–20)
CALCIUM: 9 mg/dL (ref 8.9–10.3)
CHLORIDE: 109 mmol/L (ref 98–111)
CO2: 24 mmol/L (ref 22–32)
CREATININE: 0.76 mg/dL (ref 0.44–1.00)
Glucose, Bld: 119 mg/dL — ABNORMAL HIGH (ref 70–99)
Potassium: 3.8 mmol/L (ref 3.5–5.1)
SODIUM: 140 mmol/L (ref 135–145)

## 2018-07-30 LAB — CBC
HCT: 31.7 % — ABNORMAL LOW (ref 36.0–46.0)
Hemoglobin: 9.2 g/dL — ABNORMAL LOW (ref 12.0–15.0)
MCH: 19.7 pg — ABNORMAL LOW (ref 26.0–34.0)
MCHC: 29 g/dL — AB (ref 30.0–36.0)
MCV: 67.9 fL — ABNORMAL LOW (ref 80.0–100.0)
NRBC: 0 % (ref 0.0–0.2)
PLATELETS: 372 10*3/uL (ref 150–400)
RBC: 4.67 MIL/uL (ref 3.87–5.11)
RDW: 16.9 % — AB (ref 11.5–15.5)
WBC: 8.3 10*3/uL (ref 4.0–10.5)

## 2018-07-30 LAB — TROPONIN I: Troponin I: <0.03 ng/mL (ref <0.03)

## 2018-07-30 MED ORDER — ALUM & MAG HYDROXIDE-SIMETH 200-200-20 MG/5ML PO SUSP
30.0000 mL | Freq: Once | ORAL | Status: AC
  Administered 2018-07-31: 30 mL via ORAL
  Filled 2018-07-30: qty 30

## 2018-07-30 MED ORDER — MISOPROSTOL 200 MCG PO TABS
200.0000 ug | ORAL_TABLET | Freq: Once | ORAL | Status: AC
  Administered 2018-07-31: 200 ug via ORAL
  Filled 2018-07-30: qty 1

## 2018-07-30 MED ORDER — SUCRALFATE 1 G PO TABS
1.0000 g | ORAL_TABLET | Freq: Once | ORAL | Status: AC
  Administered 2018-07-31: 1 g via ORAL
  Filled 2018-07-30: qty 1

## 2018-07-30 NOTE — ED Triage Notes (Signed)
Pt arrived via POV with reports of central chest pain that started this afternoon radiating to left side and down left arm.  Pt has hx of GERD and Anxiety  Pt took pepcid and xanax at home with no relief.  Pt also took 2 81mg  ASA as well.     Pt reports shortness of breath with the chest pain, increased belching, pt also reports dizziness and lightheadness as well as weakness.

## 2018-07-30 NOTE — ED Provider Notes (Signed)
[facility] Emergency Department Provider Note  ____________________________________________   First MD Initiated Contact with Patient 07/30/18 [id number]     (approximate)  I have reviewed the triage vital signs and the nursing notes.   HISTORY  Chief Complaint Chest Pain   HPI Shelly Underwood is a 57 y.o. female who self presents to the emergency department with epigastric and lower chest pain that began earlier this afternoon.  The pain is described as burning and aching and seem to radiate down the left side of her arm.  She has a longstanding history of gastric reflux with a hiatal hernia as well as anxiety.  She took Pepcid with no relief.  She subsequently took Xanax with no relief.  She then took 162 mg of aspirin and came to the emergency department.  Yesterday she said she also had a brief episode of exertional shortness of breath although had no pain whatsoever.  Her symptoms at that point resolved with rest.  She has noted no change in her stools.  She does not take nonsteroidals regularly.  She has had a colonoscopy 10 years ago but never had an endoscopy.  She denies recent surgery travel or immobilization.  No history of DVT or pulmonary embolism.    Past Medical History:  Diagnosis Date  . Anxiety   . History of mammogram 08/25/2012; 01/13/14   cat 1; neg  . History of Papanicolaou smear of cervix 03/2010;01/04/2014   -/-; -/-  . Impaired glucose tolerance   . Leiomyoma 2018   submucosal and pedunculated  . Obesity   . Panic disorder   . Skin disorder     Patient Active Problem List   Diagnosis Date Noted  . Perimenopause 07/27/2017  . Dysfunctional uterine bleeding 03/01/2017  . ASCUS of cervix with negative high risk HPV 03/01/2017    Past Surgical History:  Procedure Laterality Date  . CHOLECYSTECTOMY    . LIPOMA EXCISION    . TONSILLECTOMY      Prior to Admission medications   Medication Sig Start Date End Date Taking?  Authorizing Provider  ALPRAZolam Duanne Moron) 0.5 MG tablet TAKE 1 TABLET BY MOUTH THREE TIMES DAILY AS NEEDED FOR ANXIETY 04/30/28   Copland, Alicia B, PA-C  Betamethasone Dipropionate (SERNIVO) 0.05 % EMUL Apply topically.    [provider]  hydrOXYzine (ATARAX/VISTARIL) 25 MG tablet  02/17/17   [provider]  Minocycline HCl (SOLODYN PO) Take by mouth.    [provider]  misoprostol (CYTOTEC) 100 MCG tablet Take 1 tablet the night before appt Patient not taking: Reported on 07/27/2017 9/37/16   Copland, Elmo Putt B, PA-C  pantoprazole (PROTONIX) 40 MG tablet Take 1 tablet (40 mg total) by mouth daily. 07/31/18 07/31/19  Darel Hong, MD  tranexamic acid (LYSTEDA) 650 MG TABS tablet Take 2 tablets (1,300 mg total) 3 (three) times daily by mouth. Take during menses for a maximum of five days 96/78/93   Copland, Alicia B, PA-C    Allergies Erythromycin and Penicillins  Family History  Problem Relation Age of Onset  . Hyperlipidemia Mother   . Hypertension Maternal Grandmother   . Hypertension Maternal Grandfather   . Breast cancer Maternal Aunt 5    Social History Social History   Tobacco Use  . Smoking status: Former Research scientist (life sciences)  . Smokeless tobacco: Never Used  Substance Use Topics  . Alcohol use: Yes    Comment: occ  . Drug use: No    Review of Systems Constitutional:  No fever/chills Eyes: No visual changes. ENT: No sore throat. Cardiovascular: Positive for chest pain. Respiratory: Positive for shortness of breath. Gastrointestinal: Positive for abdominal pain.  Positive for nausea, no vomiting.  No diarrhea.  No constipation. Genitourinary: Negative for dysuria. Musculoskeletal: Negative for back pain. Skin: Negative for rash. Neurological: Negative for headaches, focal weakness or numbness.   ____________________________________________   PHYSICAL EXAM:  VITAL SIGNS: ED Triage Vitals  Enc Vitals Group     BP 07/30/18 1857 139/69      Pulse Rate 07/30/18 1857 85     Resp 07/30/18 1857 18     Temp 07/30/18 1857 98.2 F (36.8 C)     Temp Source 07/30/18 1857 Oral     SpO2 07/30/18 1857 100 %     Weight 07/30/18 1854 215 lb (97.5 kg)     Height 07/30/18 1854 5\' 7"  (1.702 m)     Head Circumference --      Peak Flow --      Pain Score 07/30/18 1854 6     Pain Loc --      Pain Edu? --      Excl. in [place]? --     Constitutional: Alert and oriented x4 well-appearing nontoxic no diaphoresis speaks in full clear sentences Eyes: PERRL EOMI. Head: Atraumatic. Nose: No congestion/rhinnorhea. Mouth/Throat: No trismus Neck: No stridor.   Cardiovascular: Normal rate, regular rhythm. Grossly normal heart sounds.  Good peripheral circulation. Respiratory: Normal respiratory effort.  No retractions. Lungs CTAB and moving good air Gastrointestinal: Soft nondistended nontender no rebound or guarding no peritonitis Rectal exam performed with female nurse chaperone GEN: Normal external exam guaiac-negative control positive brown stool Musculoskeletal: No lower extremity edema   Neurologic:  Normal speech and language. No gross focal neurologic deficits are appreciated. Skin:  Skin is warm, dry and intact. No rash noted. Psychiatric: Mood and affect are normal. Speech and behavior are normal.    ____________________________________________   DIFFERENTIAL includes but not limited to  Acute coronary syndrome, pancreatitis, gastritis, GI bleed, pulmonary embolism, aortic dissection ____________________________________________   LABS (all labs ordered are listed, but only abnormal results are displayed)  Labs Reviewed  BASIC METABOLIC PANEL - Abnormal; Notable for the following components:      Result Value   Glucose, Bld 119 (*)    All other components within normal limits  CBC - Abnormal; Notable for the following components:   Hemoglobin 9.2 (*)    HCT 31.7 (*)    MCV 67.9 (*)    MCH 19.7 (*)    MCHC 29.0 (*)    RDW 16.9  (*)    All other components within normal limits  TROPONIN I  HEPATIC FUNCTION PANEL  LIPASE, BLOOD  TROPONIN I    Lab work reviewed by me shows a new drop in the hemoglobin with low MCV and high RDW consistent with iron deficiency __________________________________________  EKG  ED ECG REPORT I, Darel Hong, the attending physician, personally viewed and interpreted this ECG.  Date: 07/30/2018 EKG Time:  Rate: 82 Rhythm: normal sinus rhythm QRS Axis: normal Intervals: normal ST/T Wave abnormalities: normal Narrative Interpretation: no evidence of acute ischemia  ____________________________________________  RADIOLOGY  6 reviewed by me with no acute disease ____________________________________________   PROCEDURES  Procedure(s) performed: no  Procedures  Critical Care performed: no  ____________________________________________   INITIAL IMPRESSION / ASSESSMENT AND PLAN / ED COURSE  Pertinent labs & imaging results that were available during my care of the patient  were reviewed by me and considered in my medical decision making (see chart for details).   As part of my medical decision making, I reviewed the following data within the [facility] History obtained from family if available, nursing notes, old chart and ekg, as well as notes from prior ED visits.  The patient comes to the emergency department with atypical chest pain.  EKG is nonischemic and first troponin is negative.  More concerning is actually a new microcytic anemia.  I performed a rectal exam which is guaiac negative however I am still concerned about intermittent upper GI bleeding.  We will give her a trial of Mylanta, Cytotec, and Carafate while were waiting for the second troponin.     ----------------------------------------- 12:58 AM on 07/31/2018 -----------------------------------------  The patient's pain is now resolved.  Prior to discharge she did tell me that  yesterday she had some exertional shortness of breath although did not have chest pain.  It is difficult to assess out if this was related to her anemia or if that could be an anginal equivalent so I will refer her to cardiology in addition to gastroenterology.  I have begun her on pantoprazole and strict return precautions have been given.  The patient verbalizes understanding and agreement with the plan. ____________________________________________   FINAL CLINICAL IMPRESSION(S) / ED DIAGNOSES  Final diagnoses:  Nonspecific chest pain  Iron deficiency anemia, unspecified iron deficiency anemia type      NEW MEDICATIONS STARTED DURING THIS VISIT:  Discharge Medication List as of 07/31/2018 12:58 AM    START taking these medications   Details  pantoprazole (PROTONIX) 40 MG tablet Take 1 tablet (40 mg total) by mouth daily., Starting Mon 07/31/2018, Until Tue 07/31/2019, Print         Note:  This document was prepared using Dragon voice recognition software and may include unintentional dictation errors.     Darel Hong, MD 07/31/18 [phone]

## 2018-07-31 LAB — HEPATIC FUNCTION PANEL
ALK PHOS: 55 U/L (ref 38–126)
ALT: 16 U/L (ref 0–44)
AST: 20 U/L (ref 15–41)
Albumin: 3.9 g/dL (ref 3.5–5.0)
BILIRUBIN TOTAL: 0.3 mg/dL (ref 0.3–1.2)
Total Protein: 7.2 g/dL (ref 6.5–8.1)

## 2018-07-31 LAB — TROPONIN I: Troponin I: <0.03 ng/mL (ref <0.03)

## 2018-07-31 LAB — LIPASE, BLOOD: LIPASE: 33 U/L (ref 11–51)

## 2018-07-31 MED ORDER — PANTOPRAZOLE SODIUM 40 MG PO TBEC: 40.0000 mg | DELAYED_RELEASE_TABLET | Freq: Every day | ORAL | 0 refills | Status: DC

## 2018-07-31 NOTE — ED Notes (Signed)
ED Provider at bedside. 

## 2018-07-31 NOTE — Discharge Instructions (Addendum)
Please begin taking your antacid every day as prescribed and make an appointment to follow-up with the gastroenterologist within 1 week for reevaluation.  Refrain from using all nonsteroidal anti-inflammatory medication such as aspirin, ibuprofen, naproxen, Aleve, Motrin, and Advil.  Please begin taking over-the-counter iron supplementation every day to help with your anemia and also make an appointment to follow-up with the cardiologist.  Return to the emergency department for any concerns.  It was a pleasure to take care of you today, and thank you for coming to our emergency department.  If you have any questions or concerns before leaving please ask the nurse to grab me and I'm more than happy to go through your aftercare instructions again.  If you were prescribed any opioid pain medication today such as Norco, Vicodin, Percocet, morphine, hydrocodone, or oxycodone please make sure you do not drive when you are taking this medication as it can alter your ability to drive safely.  If you have any concerns once you are home that you are not improving or are in fact getting worse before you can make it to your follow-up appointment, please do not hesitate to call 911 and come back for further evaluation.  Darel Hong, MD  Results for orders placed or performed during the hospital encounter of 41/96/22  Basic metabolic panel  Result Value Ref Range   Sodium 140 135 - 145 mmol/L   Potassium 3.8 3.5 - 5.1 mmol/L   Chloride 109 98 - 111 mmol/L   CO2 24 22 - 32 mmol/L   Glucose, Bld 119 (H) 70 - 99 mg/dL   BUN 14 6 - 20 mg/dL   Creatinine, Ser 0.76 0.44 - 1.00 mg/dL   Calcium 9.0 8.9 - 10.3 mg/dL   GFR calc non Af Amer >60 >60 mL/min   GFR calc Af Amer >60 >60 mL/min   Anion gap 7 5 - 15  CBC  Result Value Ref Range   WBC 8.3 4.0 - 10.5 K/uL   RBC 4.67 3.87 - 5.11 MIL/uL   Hemoglobin 9.2 (L) 12.0 - 15.0 g/dL   HCT 31.7 (L) 36.0 - 46.0 %   MCV 67.9 (L) 80.0 - 100.0 fL   MCH 19.7 (L)  26.0 - 34.0 pg   MCHC 29.0 (L) 30.0 - 36.0 g/dL   RDW 16.9 (H) 11.5 - 15.5 %   Platelets 372 150 - 400 K/uL   nRBC 0.0 0.0 - 0.2 %  Troponin I - ONCE - STAT  Result Value Ref Range   Troponin I <0.03 <0.03 ng/mL  Hepatic function panel  Result Value Ref Range   Total Protein 7.2 6.5 - 8.1 g/dL   Albumin 3.9 3.5 - 5.0 g/dL   AST 20 15 - 41 U/L   ALT 16 0 - 44 U/L   Alkaline Phosphatase 55 38 - 126 U/L   Total Bilirubin 0.3 0.3 - 1.2 mg/dL   Bilirubin, Direct <0.1 0.0 - 0.2 mg/dL   Indirect Bilirubin NOT CALCULATED 0.3 - 0.9 mg/dL  Lipase, blood  Result Value Ref Range   Lipase 33 11 - 51 U/L  Troponin I - Once  Result Value Ref Range   Troponin I <0.03 <0.03 ng/mL   Dg Chest 2 View  Result Date: 07/30/2018 CLINICAL DATA:  Chest pain, shortness of breath EXAM: CHEST - 2 VIEW COMPARISON:  02/24/2009 FINDINGS: Heart and mediastinal contours are within normal limits. No focal opacities or effusions. No acute bony abnormality. IMPRESSION: No active cardiopulmonary disease.  Electronically Signed   By: Rolm Baptise M.D.   On: 07/30/2018 19:12

## 2018-08-04 ENCOUNTER — Telehealth: Payer: Self-pay

## 2018-08-04 NOTE — Telephone Encounter (Signed)
Pt calling requesting referral for mammo, annual is scheduled for 09/2018. Pt aware that we should wait for that appt to make sure she would not need a dx mammo/vs screening. Pt aware and appreciative. Also wanted me to let [facility] know that she was seen in ER for bleeding, the dx her with anemia and she is now taking iron. She may want to have some blood work at her annual, I advised her we can certainly do that to check levels. fwding to [facility] for [facility]

## 2018-08-04 NOTE — Telephone Encounter (Signed)
ok 

## 2018-09-15 ENCOUNTER — Encounter: Payer: Self-pay | Admitting: Obstetrics and Gynecology

## 2018-09-15 ENCOUNTER — Ambulatory Visit (INDEPENDENT_AMBULATORY_CARE_PROVIDER_SITE_OTHER): Payer: 59 | Admitting: Obstetrics and Gynecology

## 2018-09-15 ENCOUNTER — Other Ambulatory Visit (HOSPITAL_COMMUNITY)
Admission: RE | Admit: 2018-09-15 | Discharge: 2018-09-15 | Disposition: A | Discharge status: Home / Self Care | Payer: 59 | Source: Ambulatory Visit | Attending: Obstetrics and Gynecology | Admitting: Obstetrics and Gynecology

## 2018-09-15 VITALS — BP 150/70 | HR 84 | Ht 66.0 in | Wt 219.0 lb

## 2018-09-15 DIAGNOSIS — Z124 Encounter for screening for malignant neoplasm of cervix: Secondary | ICD-10-CM | POA: Insufficient documentation

## 2018-09-15 DIAGNOSIS — Z1239 Encounter for other screening for malignant neoplasm of breast: Secondary | ICD-10-CM

## 2018-09-15 DIAGNOSIS — Z1151 Encounter for screening for human papillomavirus (HPV): Secondary | ICD-10-CM | POA: Insufficient documentation

## 2018-09-15 DIAGNOSIS — Z01419 Encounter for gynecological examination (general) (routine) without abnormal findings: Secondary | ICD-10-CM | POA: Diagnosis not present

## 2018-09-15 DIAGNOSIS — N921 Excessive and frequent menstruation with irregular cycle: Secondary | ICD-10-CM

## 2018-09-15 DIAGNOSIS — D500 Iron deficiency anemia secondary to blood loss (chronic): Secondary | ICD-10-CM

## 2018-09-15 NOTE — Progress Notes (Signed)
Chief Complaint  Patient presents with  . Gynecologic Exam    has questions regarding recent blood work and almost medically diagnosed with anemia    HPI:      [name] is a 58 y.o. No obstetric history on file. who LMP was No LMP recorded. (Menstrual status: Perimenopausal)., presents today for her annual examination.  Her menses are Q3-5 wks, lasting 2-4 days, very heavy for the first 2 days. Dysmenorrhea mod, occurring before menses. Had 12 days menses last month, but that is unusual for pt. Had DUB 6/18 with IUD, but IUD found to be in LUS. Sx resolved after removal. Menses have been monthly without BTB for the most part, but have been heavy for over a year. Pt tried Lysteda 11/18 without much improvement. Was not  interested in replacement IUD at that time.  Pt was found to be anemic in ED 11/19. Pt taking iron supp and seeing GI. HgB and Hct improved with recent labs, but still anemic. Has colonoscopy and endoscopy sched for end of 1/20. Hx of 2 leio, 1 pedunculated and 1 submucosal on u/s 6/18.  Sex activity: not sexually active. She does not have vaginal dryness.  Last Pap: 02/22/17 Results were: ASCUS/neg HPV DNA. Repeat pap due. Hx of STDs: none  Last mammogram: not recent There is a FH of breast cancer in her mat aunt, genetic testing not indicated. There is no FH of ovarian cancer. The patient does do self-breast exams.  Colonoscopy: colonoscopy 3 years ago without abnormalities.  Tobacco use: The patient denies current or previous tobacco use. Alcohol use: social drinker Exercise: moderately active  She does get adequate calcium and Vitamin D in her diet. She has a PCP.   CBC 07/31/18 at [facility] ED    Component Value Date/Time   WBC 8.3 07/30/2018 1902   RBC 4.67 07/30/2018 1902   HGB 9.2 (L) 07/30/2018 1902   HCT 31.7 (L) 07/30/2018 1902   PLT 372 07/30/2018 1902   MCV 67.9 (L) 07/30/2018 1902   MCH 19.7 (L) 07/30/2018 1902   MCHC 29.0 (L) 07/30/2018 1902    RDW 16.9 (H) 07/30/2018 1902     Past Medical History:  Diagnosis Date  . Anxiety   . History of mammogram 08/25/2012; 01/13/14   cat 1; neg  . History of Papanicolaou smear of cervix 03/2010;01/04/2014   -/-; -/-  . Impaired glucose tolerance   . Leiomyoma 2018   submucosal and pedunculated  . Obesity   . Panic disorder   . Skin disorder     Past Surgical History:  Procedure Laterality Date  . CHOLECYSTECTOMY    . LIPOMA EXCISION    . TONSILLECTOMY      Family History  Problem Relation Age of Onset  . Hyperlipidemia Mother   . Hypertension Maternal Grandmother   . Hypertension Maternal Grandfather   . Breast cancer Maternal Aunt 25    Social History   Socioeconomic History  . Marital status: Married    Spouse name: Not on file  . Number of children: Not on file  . Years of education: 56  . Highest education level: Not on file  Occupational History  . Not on file  Social Needs  . Financial resource strain: Not on file  . Food insecurity:    Worry: Not on file    Inability: Not on file  . Transportation needs:    Medical: Not on file    Non-medical: Not on file  Tobacco Use  . Smoking status: Former Research scientist (life sciences)  . Smokeless tobacco: Never Used  Substance and Sexual Activity  . Alcohol use: Yes    Comment: occ  . Drug use: No  . Sexual activity: Not Currently  Lifestyle  . Physical activity:    Days per week: Not on file    Minutes per session: Not on file  . Stress: Not on file  Relationships  . Social connections:    Talks on phone: Not on file    Gets together: Not on file    Attends religious service: Not on file    Active member of club or organization: Not on file    Attends meetings of clubs or organizations: Not on file    Relationship status: Not on file  . Intimate partner violence:    Fear of current or ex partner: Not on file    Emotionally abused: Not on file    Physically abused: Not on file    Forced sexual activity: Not on file    Other Topics Concern  . Not on file  Social History Narrative  . Not on file     Current Outpatient Medications:  .  albuterol (PROVENTIL HFA;VENTOLIN HFA) 108 (90 Base) MCG/ACT inhaler, Inhale into the lungs., Disp: , Rfl:  .  ALPRAZolam (XANAX) 0.5 MG tablet, TAKE 1 TABLET BY MOUTH THREE TIMES DAILY AS NEEDED FOR ANXIETY, Disp: 30 tablet, Rfl: 0 .  Ascorbic Acid POWD, Take by mouth., Disp: , Rfl:  .  Cetirizine HCl 10 MG CAPS, Take by mouth., Disp: , Rfl:  .  famotidine (PEPCID) 10 MG tablet, Take by mouth., Disp: , Rfl:  .  Melatonin 5 MG CAPS, Take by mouth., Disp: , Rfl:  .  pantoprazole (PROTONIX) 40 MG tablet, Take 1 tablet (40 mg total) by mouth daily., Disp: 30 tablet, Rfl: 0 .  tranexamic acid (LYSTEDA) 650 MG TABS tablet, Take 2 tablets (1,300 mg total) 3 (three) times daily by mouth. Take during menses for a maximum of five days, Disp: 30 tablet, Rfl: 7 .  UNABLE TO FIND, Med Name: Spatone Liquid Iron (5 mg of Iron), Disp: , Rfl:    ROS:  Review of Systems  Constitutional: Positive for fatigue. Negative for fever and unexpected weight change.  Respiratory: Positive for shortness of breath. Negative for cough and wheezing.   Cardiovascular: Negative for chest pain, palpitations and leg swelling.  Gastrointestinal: Positive for constipation and nausea. Negative for blood in stool, diarrhea and vomiting.  Endocrine: Negative for cold intolerance, heat intolerance and polyuria.  Genitourinary: Positive for menstrual problem. Negative for dyspareunia, dysuria, flank pain, frequency, genital sores, hematuria, pelvic pain, urgency, vaginal bleeding, vaginal discharge and vaginal pain.  Musculoskeletal: Positive for joint swelling. Negative for arthralgias, back pain and myalgias.  Skin: Positive for color change and rash.  Neurological: Positive for dizziness, numbness and headaches. Negative for syncope and light-headedness.  Hematological: Negative for adenopathy.   Psychiatric/Behavioral: Positive for agitation. Negative for confusion, sleep disturbance and suicidal ideas. The patient is nervous/anxious.      Objective: BP (!) 150/70   Pulse 84   Ht 5\' 6"  (1.676 m)   Wt 219 lb (99.3 kg)   BMI 35.35 kg/m    Physical Exam Constitutional:      Appearance: She is well-developed.  Genitourinary:     Vulva, vagina, cervix, uterus, right adnexa and left adnexa normal.     No vulval lesion or tenderness noted.  No vaginal discharge, erythema or tenderness.     No cervical polyp.     Uterus is not enlarged or tender.     No right or left adnexal mass present.     Right adnexa not tender.     Left adnexa not tender.  Neck:     Musculoskeletal: Normal range of motion.     Thyroid: No thyromegaly.  Cardiovascular:     Rate and Rhythm: Normal rate and regular rhythm.     Heart sounds: Normal heart sounds. No murmur.  Pulmonary:     Effort: Pulmonary effort is normal.     Breath sounds: Normal breath sounds.  Chest:     Breasts:        Right: No mass, nipple discharge, skin change or tenderness.        Left: No mass, nipple discharge, skin change or tenderness.  Abdominal:     Palpations: Abdomen is soft.     Tenderness: There is no abdominal tenderness. There is no guarding.  Musculoskeletal: Normal range of motion.  Neurological:     Mental Status: She is alert and oriented to person, place, and time.     Cranial Nerves: No cranial nerve deficit.  Psychiatric:        Behavior: Behavior normal.  Vitals signs reviewed.     Assessment/Plan:  Encounter for annual routine gynecological examination  Cervical cancer screening - Plan: Cytology - PAP  Screening for HPV (human papillomavirus) - Plan: Cytology - PAP  Screening for breast cancer - Pt to sched mammo - Plan: MM 3D SCREEN BREAST BILATERAL  Menorrhagia with irregular cycle - With anemia. Check GYN u/s and f/u with MD for mgmt, poss EMB depending on results.  - Plan: US  PELVIS TRANSVANGINAL NON-OB (TV ONLY)         GYN counsel mammography screening, menopause     F/U  Return in 1 week (on 09/22/2018) for GYN u/s for DUB/menorrhagia, f/u with MD after (pt needs Fri PM appt).  Alicia B. Copland, PA-C 09/15/2018 4:48 PM

## 2018-09-15 NOTE — Patient Instructions (Signed)
I value your feedback and entrusting us with your care. If you get a [facility] patient survey, I would appreciate you taking the time to let us know about your experience today. Thank you!  [facility]: [phone]  [facility]: [phone]  

## 2018-09-19 LAB — CYTOLOGY - PAP
DIAGNOSIS: NEGATIVE
HPV (WINDOPATH): NOT DETECTED

## 2018-09-29 ENCOUNTER — Encounter: Payer: Self-pay | Admitting: Obstetrics and Gynecology

## 2018-09-29 ENCOUNTER — Ambulatory Visit (INDEPENDENT_AMBULATORY_CARE_PROVIDER_SITE_OTHER): Payer: 59

## 2018-09-29 ENCOUNTER — Ambulatory Visit (INDEPENDENT_AMBULATORY_CARE_PROVIDER_SITE_OTHER): Payer: 59 | Admitting: Obstetrics and Gynecology

## 2018-09-29 ENCOUNTER — Other Ambulatory Visit (HOSPITAL_COMMUNITY)
Admission: RE | Admit: 2018-09-29 | Discharge: 2018-09-29 | Disposition: A | Discharge status: Home / Self Care | Payer: 59 | Source: Ambulatory Visit | Attending: Obstetrics and Gynecology | Admitting: Obstetrics and Gynecology

## 2018-09-29 VITALS — BP 120/60 | HR 75 | Ht 66.0 in | Wt 221.0 lb

## 2018-09-29 DIAGNOSIS — N939 Abnormal uterine and vaginal bleeding, unspecified: Secondary | ICD-10-CM | POA: Insufficient documentation

## 2018-09-29 DIAGNOSIS — D62 Acute posthemorrhagic anemia: Secondary | ICD-10-CM | POA: Diagnosis not present

## 2018-09-29 DIAGNOSIS — N921 Excessive and frequent menstruation with irregular cycle: Secondary | ICD-10-CM | POA: Diagnosis not present

## 2018-09-29 NOTE — Progress Notes (Signed)
Gynecology Ultrasound Follow Up   Chief Complaint  Patient presents with  . Menorrhagia    had u/s   Abnormal uterine bleeding with ultrasound and follow up today.  History of Present Illness: Patient is a 58 y.o. female who presents today for ultrasound evaluation of abnormal uterine bleeding and resultant acute blood loss anemia.  Ultrasound demonstrates the following findings Adnexa: no masses seen  Uterus: anteverted with endometrial stripe  9.2 mm Additional: multiple fibroids (see report below). Notable an increase in size from prior study of a submucosal fibroid (3.4 x 3.1 x 2.5 cm currently - prior 6/18  1.4 x 1.7 x 1.5 cm).  While she has had irregular menses for a while, her current level of abnormal bleeding is new and has resulted in a new diagnosis of anemia for which she is taking iron pills and will be receiving an iron infusion soon.  Because of her blood loss she is undergoing upper and lower endoscopy of her GI system.  She states that mostly her menses are quite heavy and frequent.  She has gone as long as 49 days between menses.  She has never gone an entire year without menses.  Per her report, 49 days is the longest she has gone.  She notes a new lethargy and tiredness and believes it is a result of her new anemia.  She has not yet had an endometrial biopsy as part of her evaluation for this condition.  She is quite reluctant to undergo any surgical or even medical management of her issue.  Past Medical History:  Diagnosis Date  . Anxiety   . History of mammogram 08/25/2012; 01/13/14   cat 1; neg  . History of Papanicolaou smear of cervix 03/2010;01/04/2014   -/-; -/-  . Impaired glucose tolerance   . Leiomyoma 2018   submucosal and pedunculated  . Obesity   . Panic disorder   . Skin disorder     Past Surgical History:  Procedure Laterality Date  . CHOLECYSTECTOMY    . LIPOMA EXCISION    . TONSILLECTOMY     Family History  Problem Relation Age of Onset    . Hyperlipidemia Mother   . Hypertension Maternal Grandmother   . Hypertension Maternal Grandfather   . Breast cancer Maternal Aunt 8   Social History   Socioeconomic History  . Marital status: Married    Spouse name: Not on file  . Number of children: Not on file  . Years of education: 64  . Highest education level: Not on file  Occupational History  . Not on file  Social Needs  . Financial resource strain: Not on file  . Food insecurity:    Worry: Not on file    Inability: Not on file  . Transportation needs:    Medical: Not on file    Non-medical: Not on file  Tobacco Use  . Smoking status: Former Research scientist (life sciences)  . Smokeless tobacco: Never Used  Substance and Sexual Activity  . Alcohol use: Yes    Comment: occ  . Drug use: No  . Sexual activity: Not Currently  Lifestyle  . Physical activity:    Days per week: Not on file    Minutes per session: Not on file  . Stress: Not on file  Relationships  . Social connections:    Talks on phone: Not on file    Gets together: Not on file    Attends religious service: Not on file    Active  member of club or organization: Not on file    Attends meetings of clubs or organizations: Not on file    Relationship status: Not on file  . Intimate partner violence:    Fear of current or ex partner: Not on file    Emotionally abused: Not on file    Physically abused: Not on file    Forced sexual activity: Not on file  Other Topics Concern  . Not on file  Social History Narrative  . Not on file    Allergies  Allergen Reactions  . Erythromycin Rash  . Penicillins Rash    Prior to Admission medications   Medication Sig Start Date End Date Taking? Authorizing Provider  albuterol (PROVENTIL HFA;VENTOLIN HFA) 108 (90 Base) MCG/ACT inhaler Inhale into the lungs. 02/06/15  Yes [provider]  ALPRAZolam (XANAX) 0.5 MG tablet TAKE 1 TABLET BY MOUTH THREE TIMES DAILY AS NEEDED FOR ANXIETY 2/54/27  Yes Copland, Alicia B, PA-C   Ascorbic Acid POWD Take by mouth.   Yes [provider]  Cetirizine HCl 10 MG CAPS Take by mouth.   Yes [provider]  famotidine (PEPCID) 10 MG tablet Take by mouth.   Yes [provider]  Melatonin 5 MG CAPS Take by mouth.   Yes [provider]  pantoprazole (PROTONIX) 40 MG tablet Take 1 tablet (40 mg total) by mouth daily. 07/31/18 07/31/19 Yes Darel Hong, MD  UNABLE TO FIND Med Name: Spatone Liquid Iron (5 mg of Iron)   Yes [provider]   Review of Systems  Constitutional: Positive for malaise/fatigue. Negative for chills, diaphoresis, fever and weight loss.  HENT: Negative.   Eyes: Negative.   Respiratory: Negative.   Cardiovascular: Negative.   Gastrointestinal: Negative.   Genitourinary: Negative.        See HPI  Musculoskeletal: Negative.   Skin: Negative.   Neurological: Negative.   Psychiatric/Behavioral: Negative.      Physical Exam BP 120/60   Pulse 75   Ht 5\' 6"  (1.676 m)   Wt 221 lb (100.2 kg)   BMI 35.67 kg/m    Physical Exam Constitutional:      General: She is not in acute distress.    Appearance: Normal appearance.  HENT:     Head: Normocephalic and atraumatic.  Eyes:     General: No scleral icterus.    Conjunctiva/sclera: Conjunctivae normal.  Abdominal:     Hernia: There is no hernia in the right inguinal area or left inguinal area.  Genitourinary:    General: Normal vulva.     Exam position: Supine.     Labia:        Right: No rash, tenderness or lesion.        Left: No rash, tenderness or lesion.      Urethra: No prolapse or urethral lesion.     Vagina: Normal.     Cervix: Cervical bleeding present. No friability, lesion or erythema.  Neurological:     General: No focal deficit present.     Mental Status: She is alert and oriented to person, place, and time.     Cranial Nerves: No cranial nerve deficit.  Psychiatric:        Mood and Affect: Mood normal.        Behavior: Behavior  normal.        Judgment: Judgment normal.    Endometrial Biopsy After discussion with the patient regarding her abnormal uterine bleeding I recommended that she proceed with  an endometrial biopsy for further diagnosis. The risks, benefits, alternatives, and indications for an endometrial biopsy were discussed with the patient in detail. She understood the risks including infection, bleeding, cervical laceration and uterine perforation.  Verbal consent was obtained.   PROCEDURE NOTE:  Pipelle endometrial biopsy was performed using aseptic technique with iodine preparation.  The uterus was sounded to a length of 7.5 cm.  Adequate sampling was obtained with minimal blood loss.  The patient tolerated the procedure well.  Disposition will be pending pathology.  Imaging Results: US Pelvis Transvanginal Non-ob (tv Only)  Result Date: 09/30/2018 Patient Name: Shelly Underwood DOB: 13-Sep-1961 MRN: [id number] ULTRASOUND REPORT Location: [facility] Date of Service: 09/29/2018 Indications: Abnormal uterine bleeding Findings: The uterus is anteverted and measures 10.7 x 6.4 x 6.4cm. Echo texture is heterogenous with evidence of focal masses. Within the uterus are multiple suspected fibroids measuring: Fibroid 1:  3.4 x 3.1 x 2.5cm (Right/mid, pedunculated). Unchanged from prior. Fibroid 2:  3.5 x 3.3 x 2.8cm (midline/anterior, Submucosal). Larger than prior. Fibroid 3:  1.9 x 1.7 x 1.6cm (Left/anterior, Subserosal). New from prior. The Endometrium is heterogeneous and measures 9.2 mm. Difficult to assess due to heterogeneity of myometrium and fibroids. Right Ovary measures 2.2 x 1.8 x 1.0cm. It is normal in appearance. Left Ovary measures 2.4 x 2.1 x 1.3cm. It is normal in appearance. Survey of the adnexa demonstrates no adnexal masses. There is no free fluid in the cul de sac. Impression: 1. Heterogeneous, multi-fibroid uterus. Three discrete fibroids as described above. 2. Difficult to assess endometrium due to  heterogeneous myometrium and fibroids Vita Barley, RDMS RVT The ultrasound images and findings were reviewed by me and I agree with the above report. Prentice Docker, MD, [facility], [facility] 09/29/2018 4:47 PM    Assessment: 58 y.o. [id number]  1. Anemia due to acute blood loss   2. Abnormal uterine bleeding      Plan: Problem List Items Addressed This Visit      Genitourinary   Abnormal uterine bleeding   Relevant Orders   Surgical pathology     Other   Anemia due to acute blood loss - Primary   Relevant Orders   Surgical pathology     Anemia due to acute blood loss: This is quite likely due to a new and increased level of menstrual bleeding.  An endometrial biopsy was performed today to investigate this further.  The different treatment options were discussed with the patient today, including; clinical observation (do nothing at this time), medication management, surgical management with either hysteroscopy with guided endometrial sampling and submucosal myomectomy or hysterectomy. 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  She agreed to await the results of the biopsy prior to proceeding with any decision on treatment. However, she did agree that some form of treatment to lower her amount of blood loss with her menses in order to allow her to recover her blood counts would be something she might agree to.  We did discuss the low possibility of cancer. Endometrial biopsy today to better inform this question.    20 minutes spent in face to face discussion with > 50% spent in counseling,management, and coordination of care of her anemia due to acute blood loss and abnormal uterine bleeding.   Prentice Docker, MD, [facility], [place]  09/30/2018 2:49 PM

## 2018-09-30 ENCOUNTER — Encounter: Payer: Self-pay | Admitting: Obstetrics and Gynecology

## 2018-10-04 ENCOUNTER — Inpatient Hospital Stay: Payer: 59

## 2018-10-04 ENCOUNTER — Encounter: Payer: Self-pay | Admitting: Internal Medicine

## 2018-10-04 ENCOUNTER — Inpatient Hospital Stay: Payer: 59 | Attending: Internal Medicine | Admitting: Internal Medicine

## 2018-10-04 ENCOUNTER — Other Ambulatory Visit: Payer: Self-pay

## 2018-10-04 VITALS — BP 119/68 | HR 78 | Temp 98.1°F | Resp 16 | Wt 215.0 lb

## 2018-10-04 DIAGNOSIS — R0609 Other forms of dyspnea: Secondary | ICD-10-CM | POA: Diagnosis not present

## 2018-10-04 DIAGNOSIS — D509 Iron deficiency anemia, unspecified: Secondary | ICD-10-CM | POA: Diagnosis not present

## 2018-10-04 DIAGNOSIS — D500 Iron deficiency anemia secondary to blood loss (chronic): Secondary | ICD-10-CM

## 2018-10-04 DIAGNOSIS — Z803 Family history of malignant neoplasm of breast: Secondary | ICD-10-CM | POA: Diagnosis not present

## 2018-10-04 DIAGNOSIS — N920 Excessive and frequent menstruation with regular cycle: Secondary | ICD-10-CM | POA: Diagnosis not present

## 2018-10-04 DIAGNOSIS — Z87891 Personal history of nicotine dependence: Secondary | ICD-10-CM | POA: Insufficient documentation

## 2018-10-04 LAB — CBC WITH DIFFERENTIAL/PLATELET
ABS IMMATURE GRANULOCYTES: 0.01 10*3/uL (ref 0.00–0.07)
Basophils Absolute: 0 10*3/uL (ref 0.0–0.1)
Basophils Relative: 0 %
Eosinophils Absolute: 0.2 10*3/uL (ref 0.0–0.5)
Eosinophils Relative: 3 %
HEMATOCRIT: 35.9 % — AB (ref 36.0–46.0)
Hemoglobin: 10.3 g/dL — ABNORMAL LOW (ref 12.0–15.0)
IMMATURE GRANULOCYTES: 0 %
LYMPHS ABS: 2.4 10*3/uL (ref 0.7–4.0)
LYMPHS PCT: 33 %
MCH: 20.6 pg — ABNORMAL LOW (ref 26.0–34.0)
MCHC: 28.7 g/dL — ABNORMAL LOW (ref 30.0–36.0)
MCV: 71.7 fL — AB (ref 80.0–100.0)
MONOS PCT: 6 %
Monocytes Absolute: 0.4 10*3/uL (ref 0.1–1.0)
NEUTROS ABS: 4.2 10*3/uL (ref 1.7–7.7)
NEUTROS PCT: 58 %
Platelets: 350 10*3/uL (ref 150–400)
RBC: 5.01 MIL/uL (ref 3.87–5.11)
RDW: 19 % — ABNORMAL HIGH (ref 11.5–15.5)
WBC: 7.3 10*3/uL (ref 4.0–10.5)
nRBC: 0 % (ref 0.0–0.2)

## 2018-10-04 LAB — LACTATE DEHYDROGENASE: LDH: 123 U/L (ref 98–192)

## 2018-10-04 NOTE — Assessment & Plan Note (Addendum)
#   iron deficiency Anemia: Likely secondary to heavy menstrual./Menorrhagia.  Most recent hemoglobin 10 ferritin 5.  MCV in the 70s.  On p.o. iron-not improving/symptomatic.  #Recommend IV iron/Feraheme weekly x2; discussed the potential infusion reactions.  Plan starting next week.   #Etiology likely menorrhagia - s/p Gyn [west side]; await work-up.  Discussed at length that her blood loss has to be controlled for more permanent solution of iron deficiency anemia.  Holding GI work-up at this time.  Thank you for allowing me to participate in the care of your pleasant patient. Please do not hesitate to contact me with questions or concerns in the interim.  # 45 minutes face-to-face with the patient discussing the above plan of care; more than 50% of time spent on prognosis/ natural history; counseling and coordination.   # DISPOSITION: # labs today-cbc/LDH # IV ferrahem weekly x 2; start next week # follow up 6 weeks; labs-cbc/possible ferrahem-Dr.B

## 2018-10-04 NOTE — Progress Notes (Signed)
[facility] CONSULT NOTE  Patient Care Team: Patient, No Pcp Per as PCP - General (General Practice)  CHIEF COMPLAINTS/PURPOSE OF CONSULTATION:    HEMATOLOGY HISTORY  # IRON DEFICIENCY ANEMIA sec to menorraghia-  EGD/ colonoscopy- 2016-[facility];;capsule-none  #Menorrhagia [name] side gyn/US- Jan 2020]  HISTORY OF PRESENTING ILLNESS:  Shelly Underwood 58 y.o.  female has been referred to [place] for further evaluation/work-up for anemia.  Patient was in the emergency room in November for chest discomfort.  Work-up showed microcytic anemia.  Patient was recently seen by GI-blood work including iron studies suggestive of iron deficiency.  She is also evaluated by gynecology for history of heavy menstrual periods.  She had an ultrasound and also had a biopsy. Iron supplementation: P.o. iron last 1-2 month  Complains of shortness of breath on exertion.  Also complains of pica.  Restless legs and also difficulty concentration.  Blood in stools: None; last colonoscopy [3 years; no polyps/EGD- negative] Change in bowel habits- None Blood in urine: None Vaginal bleeding: heavy for 18 months Difficulty swallowing: None Abnormal weight loss: None  Review of Systems  Constitutional: Positive for malaise/fatigue. Negative for chills, diaphoresis and fever.  HENT: Negative for nosebleeds and sore throat.   Eyes: Negative for double vision.  Respiratory: Positive for shortness of breath. Negative for cough, hemoptysis, sputum production and wheezing.   Cardiovascular: Negative for chest pain, palpitations, orthopnea and leg swelling.  Gastrointestinal: Negative for abdominal pain, blood in stool, constipation, diarrhea, heartburn, melena, nausea and vomiting.  Genitourinary: Negative for dysuria, frequency and urgency.  Musculoskeletal: Negative for back pain and joint pain.  Skin: Negative.  Negative for itching and rash.  Neurological: Negative for dizziness, tingling, focal weakness,  weakness and headaches.  Endo/Heme/Allergies: Bruises/bleeds easily (heavy menstrual periods).  Psychiatric/Behavioral: Negative for depression. The patient is not nervous/anxious and does not have insomnia.     MEDICAL HISTORY:  Past Medical History:  Diagnosis Date  . Anxiety   . History of mammogram 08/25/2012; 01/13/14   cat 1; neg  . History of Papanicolaou smear of cervix 03/2010;01/04/2014   -/-; -/-  . Impaired glucose tolerance   . Leiomyoma 2018   submucosal and pedunculated  . Obesity   . Panic disorder   . Skin disorder     SURGICAL HISTORY: Past Surgical History:  Procedure Laterality Date  . CHOLECYSTECTOMY    . LIPOMA EXCISION    . TONSILLECTOMY      SOCIAL HISTORY: Social History   Socioeconomic History  . Marital status: Married    Spouse name: Not on file  . Number of children: Not on file  . Years of education: 59  . Highest education level: Not on file  Occupational History  . Not on file  Social Needs  . Financial resource strain: Not on file  . Food insecurity:    Worry: Not on file    Inability: Not on file  . Transportation needs:    Medical: Not on file    Non-medical: Not on file  Tobacco Use  . Smoking status: Former Research scientist (life sciences)  . Smokeless tobacco: Never Used  Substance and Sexual Activity  . Alcohol use: Yes    Comment: occ  . Drug use: No  . Sexual activity: Not Currently  Lifestyle  . Physical activity:    Days per week: Not on file    Minutes per session: Not on file  . Stress: Not on file  Relationships  . Social connections:  Talks on phone: Not on file    Gets together: Not on file    Attends religious service: Not on file    Active member of club or organization: Not on file    Attends meetings of clubs or organizations: Not on file    Relationship status: Not on file  . Intimate partner violence:    Fear of current or ex partner: Not on file    Emotionally abused: Not on file    Physically abused: Not on file     Forced sexual activity: Not on file  Other Topics Concern  . 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 2 children- son in [place].     FAMILY HISTORY: Family History  Problem Relation Age of Onset  . Hyperlipidemia Mother   . Hypertension Maternal Grandmother   . Hypertension Maternal Grandfather   . Breast cancer Maternal Aunt 50    ALLERGIES:  is allergic to erythromycin and penicillins.  MEDICATIONS:  Current Outpatient Medications  Medication Sig Dispense Refill  . Carbonyl Iron 18 MG CHEW Chew by mouth.    Marland Kitchen albuterol (PROVENTIL HFA;VENTOLIN HFA) 108 (90 Base) MCG/ACT inhaler Inhale into the lungs.    . ALPRAZolam (XANAX) 0.5 MG tablet TAKE 1 TABLET BY MOUTH THREE TIMES DAILY AS NEEDED FOR ANXIETY 30 tablet 0  . Ascorbic Acid POWD Take by mouth.    . Cetirizine HCl 10 MG CAPS Take by mouth.    . famotidine (PEPCID) 10 MG tablet Take by mouth.    . Melatonin 5 MG CAPS Take by mouth.    . pantoprazole (PROTONIX) 40 MG tablet Take 1 tablet (40 mg total) by mouth daily. 30 tablet 0   No current facility-administered medications for this visit.       PHYSICAL EXAMINATION:   Vitals:   10/04/18 1129  BP: 119/68  Pulse: 78  Resp: 16  Temp: 98.1 F (36.7 C)   Filed Weights   10/04/18 1129  Weight: 215 lb (97.5 kg)    Physical Exam  Constitutional: She is oriented to person, place, and time and well-developed, well-nourished, and in no distress.  Positive for pallor.  Alone.  HENT:  Head: Normocephalic and atraumatic.  Mouth/Throat: Oropharynx is clear and moist. No oropharyngeal exudate.  Eyes: Pupils are equal, round, and reactive to light.  Neck: Normal range of motion. Neck supple.  Cardiovascular: Normal rate and regular rhythm.  Pulmonary/Chest: No respiratory distress. She has no wheezes.  Abdominal: Soft. Bowel sounds are normal. She exhibits no  distension and no mass. There is no abdominal tenderness. There is no rebound and no guarding.  Musculoskeletal: Normal range of motion.        General: No tenderness or edema.  Neurological: She is alert and oriented to person, place, and time.  Skin: Skin is warm. There is pallor.  Psychiatric: Affect normal.    LABORATORY DATA:  I have reviewed the data as listed Lab Results  Component Value Date   WBC 7.3 10/04/2018   HGB 10.3 (L) 10/04/2018   HCT 35.9 (L) 10/04/2018   MCV 71.7 (L) 10/04/2018   PLT 350 10/04/2018   Recent Labs    07/29/18 1902 07/30/18 1902  NA  --  140  K  --  3.8  CL  --  109  CO2  --  24  GLUCOSE  --  119*  BUN  --  14  CREATININE  --  0.76  CALCIUM  --  9.0  GFRNONAA  --  >60  GFRAA  --  >60  PROT 7.2  --   ALBUMIN 3.9  --   AST 20  --   ALT 16  --   ALKPHOS 55  --   BILITOT 0.3  --   BILIDIR <0.1  --   IBILI NOT CALCULATED  --      US Pelvis Transvanginal Non-ob (tv Only)  Result Date: 09/30/2018 Patient Name: KIANDRIA CLUM DOB: 1961-05-09 MRN: [id number] ULTRASOUND REPORT Location: [facility] Date of Service: 09/29/2018 Indications: Abnormal uterine bleeding Findings: The uterus is anteverted and measures 10.7 x 6.4 x 6.4cm. Echo texture is heterogenous with evidence of focal masses. Within the uterus are multiple suspected fibroids measuring: Fibroid 1:  3.4 x 3.1 x 2.5cm (Right/mid, pedunculated). Unchanged from prior. Fibroid 2:  3.5 x 3.3 x 2.8cm (midline/anterior, Submucosal). Larger than prior. Fibroid 3:  1.9 x 1.7 x 1.6cm (Left/anterior, Subserosal). New from prior. The Endometrium is heterogeneous and measures 9.2 mm. Difficult to assess due to heterogeneity of myometrium and fibroids. Right Ovary measures 2.2 x 1.8 x 1.0cm. It is normal in appearance. Left Ovary measures 2.4 x 2.1 x 1.3cm. It is normal in appearance. Survey of the adnexa demonstrates no adnexal masses. There is no free fluid in the cul de sac. Impression: 1.  Heterogeneous, multi-fibroid uterus. Three discrete fibroids as described above. 2. Difficult to assess endometrium due to heterogeneous myometrium and fibroids Vita Barley, RDMS RVT The ultrasound images and findings were reviewed by me and I agree with the above report. Prentice Docker, MD, [facility], [facility] 09/29/2018 4:47 PM    Iron deficiency anemia due to chronic blood loss # iron deficiency Anemia: Likely secondary to heavy menstrual./Menorrhagia.  Most recent hemoglobin 10 ferritin 5.  MCV in the 70s.  On p.o. iron-not improving/symptomatic.  #Recommend IV iron/Feraheme weekly x2; discussed the potential infusion reactions.  Plan starting next week.   #Etiology likely menorrhagia - s/p Gyn [[facility]]; await work-up.  Discussed at length that her blood loss has to be controlled for more permanent solution of iron deficiency anemia.  Holding GI work-up at this time.  Thank you for allowing me to participate in the care of your pleasant patient. Please do not hesitate to contact me with questions or concerns in the interim.  # 45 minutes face-to-face with the patient discussing the above plan of care; more than 50% of time spent on prognosis/ natural history; counseling and coordination.   # DISPOSITION: # labs today-cbc/LDH # IV ferrahem weekly x 2; start next week # follow up 6 weeks; labs-cbc/possible ferrahem-Dr.B  All questions were answered. The patient knows to call the clinic with any problems, questions or concerns.    Cammie Sickle, MD 10/04/2018 3:06 PM

## 2018-10-06 ENCOUNTER — Ambulatory Visit
Admission: RE | Admit: 2018-10-06 | Discharge: 2018-10-06 | Disposition: A | Discharge status: Home / Self Care | Payer: 59 | Source: Ambulatory Visit | Attending: Obstetrics and Gynecology | Admitting: Obstetrics and Gynecology

## 2018-10-06 ENCOUNTER — Telehealth: Payer: Self-pay | Admitting: Obstetrics and Gynecology

## 2018-10-06 DIAGNOSIS — Z1239 Encounter for other screening for malignant neoplasm of breast: Secondary | ICD-10-CM | POA: Diagnosis present

## 2018-10-06 NOTE — Telephone Encounter (Signed)
Discussed normal results.  She will consider options of treatment.

## 2018-10-09 ENCOUNTER — Encounter: Payer: Self-pay | Admitting: Internal Medicine

## 2018-10-11 ENCOUNTER — Inpatient Hospital Stay: Payer: 59

## 2018-10-11 VITALS — BP 130/72 | HR 72 | Temp 97.5°F | Resp 16

## 2018-10-11 DIAGNOSIS — D500 Iron deficiency anemia secondary to blood loss (chronic): Secondary | ICD-10-CM

## 2018-10-11 DIAGNOSIS — D509 Iron deficiency anemia, unspecified: Secondary | ICD-10-CM | POA: Diagnosis not present

## 2018-10-11 MED ORDER — SODIUM CHLORIDE 0.9 % IV SOLN
Freq: Once | INTRAVENOUS | Status: AC
  Administered 2018-10-11: 14:00:00 via INTRAVENOUS
  Filled 2018-10-11: qty 250

## 2018-10-11 MED ORDER — SODIUM CHLORIDE 0.9 % IV SOLN
510.0000 mg | Freq: Once | INTRAVENOUS | Status: AC
  Administered 2018-10-11: 510 mg via INTRAVENOUS
  Filled 2018-10-11: qty 17

## 2018-10-11 NOTE — Progress Notes (Signed)
Notified Lauren, NP about patient complaining of being lightheaded after feraheme treatment. Patient does have a history of anxiety and seemed anxious prior to treatment . Patient vital signs are stable. Ander Purpura, NP instructed Mrs. Sferrazza to take her xanax and get rest.

## 2018-10-13 ENCOUNTER — Ambulatory Visit: Payer: 59 | Admitting: Cardiovascular Disease

## 2018-10-18 ENCOUNTER — Encounter: Payer: Self-pay | Admitting: Obstetrics and Gynecology

## 2018-10-18 ENCOUNTER — Other Ambulatory Visit: Payer: Self-pay | Admitting: Obstetrics and Gynecology

## 2018-10-18 ENCOUNTER — Inpatient Hospital Stay: Payer: Managed Care, Other (non HMO) | Attending: Internal Medicine

## 2018-10-18 VITALS — BP 131/78 | HR 68 | Temp 97.2°F

## 2018-10-18 DIAGNOSIS — D509 Iron deficiency anemia, unspecified: Secondary | ICD-10-CM | POA: Insufficient documentation

## 2018-10-18 DIAGNOSIS — D500 Iron deficiency anemia secondary to blood loss (chronic): Secondary | ICD-10-CM

## 2018-10-18 DIAGNOSIS — N939 Abnormal uterine and vaginal bleeding, unspecified: Secondary | ICD-10-CM

## 2018-10-18 MED ORDER — SODIUM CHLORIDE 0.9 % IV SOLN
Freq: Once | INTRAVENOUS | Status: AC
  Administered 2018-10-18: 14:00:00 via INTRAVENOUS
  Filled 2018-10-18: qty 250

## 2018-10-18 MED ORDER — SODIUM CHLORIDE 0.9 % IV SOLN
510.0000 mg | Freq: Once | INTRAVENOUS | Status: AC
  Administered 2018-10-18: 510 mg via INTRAVENOUS
  Filled 2018-10-18: qty 17

## 2018-10-18 MED ORDER — NORETHINDRONE ACETATE 5 MG PO TABS: 5.0000 mg | ORAL_TABLET | Freq: Every day | ORAL | 3 refills | Status: DC

## 2018-11-15 ENCOUNTER — Inpatient Hospital Stay: Payer: Managed Care, Other (non HMO) | Attending: Internal Medicine

## 2018-11-15 ENCOUNTER — Inpatient Hospital Stay (HOSPITAL_BASED_OUTPATIENT_CLINIC_OR_DEPARTMENT_OTHER): Payer: Managed Care, Other (non HMO) | Admitting: Internal Medicine

## 2018-11-15 ENCOUNTER — Encounter: Payer: Self-pay | Admitting: Internal Medicine

## 2018-11-15 ENCOUNTER — Inpatient Hospital Stay: Payer: Managed Care, Other (non HMO)

## 2018-11-15 VITALS — BP 146/82 | HR 75 | Temp 97.8°F | Resp 16 | Wt 218.2 lb

## 2018-11-15 VITALS — BP 135/80 | HR 76 | Temp 96.8°F | Resp 18

## 2018-11-15 DIAGNOSIS — R0602 Shortness of breath: Secondary | ICD-10-CM | POA: Diagnosis not present

## 2018-11-15 DIAGNOSIS — D509 Iron deficiency anemia, unspecified: Secondary | ICD-10-CM | POA: Insufficient documentation

## 2018-11-15 DIAGNOSIS — R5383 Other fatigue: Secondary | ICD-10-CM | POA: Diagnosis not present

## 2018-11-15 DIAGNOSIS — N920 Excessive and frequent menstruation with regular cycle: Secondary | ICD-10-CM | POA: Insufficient documentation

## 2018-11-15 DIAGNOSIS — D500 Iron deficiency anemia secondary to blood loss (chronic): Secondary | ICD-10-CM

## 2018-11-15 LAB — CBC WITH DIFFERENTIAL/PLATELET
Abs Immature Granulocytes: 0.03 10*3/uL (ref 0.00–0.07)
Basophils Absolute: 0 10*3/uL (ref 0.0–0.1)
Basophils Relative: 0 %
EOS ABS: 0.3 10*3/uL (ref 0.0–0.5)
EOS PCT: 4 %
HCT: 40.8 % (ref 36.0–46.0)
Hemoglobin: 12.8 g/dL (ref 12.0–15.0)
Immature Granulocytes: 0 %
Lymphocytes Relative: 29 %
Lymphs Abs: 2.2 10*3/uL (ref 0.7–4.0)
MCH: 24.7 pg — ABNORMAL LOW (ref 26.0–34.0)
MCHC: 31.4 g/dL (ref 30.0–36.0)
MCV: 78.8 fL — ABNORMAL LOW (ref 80.0–100.0)
Monocytes Absolute: 0.4 10*3/uL (ref 0.1–1.0)
Monocytes Relative: 5 %
Neutro Abs: 4.7 10*3/uL (ref 1.7–7.7)
Neutrophils Relative %: 62 %
Platelets: 224 10*3/uL (ref 150–400)
RBC: 5.18 MIL/uL — ABNORMAL HIGH (ref 3.87–5.11)
RDW: 23.1 % — ABNORMAL HIGH (ref 11.5–15.5)
WBC: 7.6 10*3/uL (ref 4.0–10.5)
nRBC: 0 % (ref 0.0–0.2)

## 2018-11-15 MED ORDER — SODIUM CHLORIDE 0.9 % IV SOLN
510.0000 mg | Freq: Once | INTRAVENOUS | Status: AC
  Administered 2018-11-15: 510 mg via INTRAVENOUS
  Filled 2018-11-15: qty 17

## 2018-11-15 MED ORDER — SODIUM CHLORIDE 0.9 % IV SOLN
Freq: Once | INTRAVENOUS | Status: AC
  Administered 2018-11-15: 15:00:00 via INTRAVENOUS
  Filled 2018-11-15: qty 250

## 2018-11-15 NOTE — Assessment & Plan Note (Addendum)
#   iron deficiency Anemia: Likely secondary to heavy menstrual./Menorrhagia. Status post IV Feraheme hemoglobin today is 12.  Patient still symptomatic.  Recommend IV Feraheme today.  #Etiology likely menorrhagia - s/p Gyn [[facility]];improved.  # DISPOSITION: # Ferrahem today # follow up 3 months; labs-cbc/iron studies/ferritin- possible ferrahem-Dr.B

## 2018-11-15 NOTE — Progress Notes (Signed)
[facility] CONSULT NOTE  Patient Care Team: Copland, Ginette Otto as PCP - General (Obstetrics and Gynecology)  CHIEF COMPLAINTS/PURPOSE OF CONSULTATION:    HEMATOLOGY HISTORY  # IRON DEFICIENCY ANEMIA sec to menorraghia-  EGD/ colonoscopy- 2016-[facility];;capsule-none  #Menorrhagia [name] side gyn/US- Jan 2020]  HISTORY OF PRESENTING ILLNESS:  Shelly Underwood 58 y.o.  female iron deficiency anemia secondary to heavy menstrual.  This is here for follow-up.  Since receiving IV iron patient symptoms have improved.  Shortness of breath improved/fatigue improved.  However continues to have residual shortness of breath on exertion and fatigue.  Review of Systems  Constitutional: Positive for malaise/fatigue. Negative for chills, diaphoresis and fever.  HENT: Negative for nosebleeds and sore throat.   Eyes: Negative for double vision.  Respiratory: Positive for shortness of breath. Negative for cough, hemoptysis, sputum production and wheezing.   Cardiovascular: Negative for chest pain, palpitations, orthopnea and leg swelling.  Gastrointestinal: Negative for abdominal pain, blood in stool, constipation, diarrhea, heartburn, melena, nausea and vomiting.  Genitourinary: Negative for dysuria, frequency and urgency.  Musculoskeletal: Negative for back pain and joint pain.  Skin: Negative.  Negative for itching and rash.  Neurological: Negative for dizziness, tingling, focal weakness, weakness and headaches.  Endo/Heme/Allergies: Bruises/bleeds easily (heavy menstrual periods).  Psychiatric/Behavioral: Negative for depression. The patient is not nervous/anxious and does not have insomnia.     MEDICAL HISTORY:  Past Medical History:  Diagnosis Date  . Anxiety   . History of mammogram 08/25/2012; 01/13/14   cat 1; neg  . History of Papanicolaou smear of cervix 03/2010;01/04/2014   -/-; -/-  . Impaired glucose tolerance   . Leiomyoma 2018   submucosal and pedunculated  .  Obesity   . Panic disorder   . Skin disorder     SURGICAL HISTORY: Past Surgical History:  Procedure Laterality Date  . CHOLECYSTECTOMY    . LIPOMA EXCISION    . TONSILLECTOMY      SOCIAL HISTORY: Social History   Socioeconomic History  . Marital status: Married    Spouse name: Not on file  . Number of children: Not on file  . Years of education: 80  . Highest education level: Not on file  Occupational History  . Not on file  Social Needs  . Financial resource strain: Not on file  . Food insecurity:    Worry: Not on file    Inability: Not on file  . Transportation needs:    Medical: Not on file    Non-medical: Not on file  Tobacco Use  . Smoking status: Former Research scientist (life sciences)  . Smokeless tobacco: Never Used  Substance and Sexual Activity  . Alcohol use: Yes    Comment: occ  . Drug use: No  . Sexual activity: Not Currently  Lifestyle  . Physical activity:    Days per week: Not on file    Minutes per session: Not on file  . Stress: Not on file  Relationships  . Social connections:    Talks on phone: Not on file    Gets together: Not on file    Attends religious service: Not on file    Active member of club or organization: Not on file    Attends meetings of clubs or organizations: Not on file    Relationship status: Not on file  . Intimate partner violence:    Fear of current or ex partner: Not on file    Emotionally abused: Not on file  Physically abused: Not on file    Forced sexual activity: Not on file  Other Topics Concern  . 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 2 children- son in [place].     FAMILY HISTORY: Family History  Problem Relation Age of Onset  . Hyperlipidemia Mother   . Hypertension Maternal Grandmother   . Hypertension Maternal Grandfather   . Breast cancer Maternal Aunt 50    ALLERGIES:  is allergic to erythromycin and  penicillins.  MEDICATIONS:  Current Outpatient Medications  Medication Sig Dispense Refill  . albuterol (PROVENTIL HFA;VENTOLIN HFA) 108 (90 Base) MCG/ACT inhaler Inhale into the lungs.    . ALPRAZolam (XANAX) 0.5 MG tablet TAKE 1 TABLET BY MOUTH THREE TIMES DAILY AS NEEDED FOR ANXIETY 30 tablet 0  . Ascorbic Acid POWD Take by mouth.    Carin Hock Iron 18 MG CHEW Chew by mouth.    . Cetirizine HCl 10 MG CAPS Take by mouth.    . famotidine (PEPCID) 10 MG tablet Take by mouth.    . Melatonin 5 MG CAPS Take by mouth.    . pantoprazole (PROTONIX) 40 MG tablet Take 1 tablet (40 mg total) by mouth daily. 30 tablet 0  . norethindrone (AYGESTIN) 5 MG tablet Take 1 tablet (5 mg total) by mouth daily. (Patient not taking: Reported on 11/15/2018) 90 tablet 3   No current facility-administered medications for this visit.       PHYSICAL EXAMINATION:   Vitals:   11/15/18 1341  BP: (!) 146/82  Pulse: 75  Resp: 16  Temp: 97.8 F (36.6 C)   Filed Weights   11/15/18 1341  Weight: 218 lb 3.2 oz (99 kg)    Physical Exam  Constitutional: She is oriented to person, place, and time and well-developed, well-nourished, and in no distress.  Positive for pallor.  Alone.  HENT:  Head: Normocephalic and atraumatic.  Mouth/Throat: Oropharynx is clear and moist. No oropharyngeal exudate.  Eyes: Pupils are equal, round, and reactive to light.  Neck: Normal range of motion. Neck supple.  Cardiovascular: Normal rate and regular rhythm.  Pulmonary/Chest: No respiratory distress. She has no wheezes.  Abdominal: Soft. Bowel sounds are normal. She exhibits no distension and no mass. There is no abdominal tenderness. There is no rebound and no guarding.  Musculoskeletal: Normal range of motion.        General: No tenderness or edema.  Neurological: She is alert and oriented to person, place, and time.  Skin: Skin is warm. There is pallor.  Psychiatric: Affect normal.    LABORATORY DATA:  I have  reviewed the data as listed Lab Results  Component Value Date   WBC 7.6 11/15/2018   HGB 12.8 11/15/2018   HCT 40.8 11/15/2018   MCV 78.8 (L) 11/15/2018   PLT 224 11/15/2018   Recent Labs    07/29/18 1902 07/30/18 1902  NA  --  140  K  --  3.8  CL  --  109  CO2  --  24  GLUCOSE  --  119*  BUN  --  14  CREATININE  --  0.76  CALCIUM  --  9.0  GFRNONAA  --  >60  GFRAA  --  >60  PROT 7.2  --   ALBUMIN 3.9  --   AST 20  --   ALT 16  --   ALKPHOS 55  --  BILITOT 0.3  --   BILIDIR <0.1  --   IBILI NOT CALCULATED  --      No results found. Iron deficiency anemia due to chronic blood loss # iron deficiency Anemia: Likely secondary to heavy menstrual./Menorrhagia. Status post IV Feraheme hemoglobin today is 12.  Patient still symptomatic.  Recommend IV Feraheme today.  #Etiology likely menorrhagia - s/p Gyn [[facility]];improved.  # DISPOSITION: # Ferrahem today # follow up 3 months; labs-cbc/iron studies/ferritin- possible ferrahem-Dr.B  All questions were answered. The patient knows to call the clinic with any problems, questions or concerns.    Cammie Sickle, MD 11/15/2018 7:29 PM

## 2019-01-30 ENCOUNTER — Other Ambulatory Visit: Payer: Self-pay | Admitting: Obstetrics and Gynecology

## 2019-01-30 ENCOUNTER — Encounter: Payer: Self-pay | Admitting: Obstetrics and Gynecology

## 2019-01-30 DIAGNOSIS — F419 Anxiety disorder, unspecified: Secondary | ICD-10-CM

## 2019-01-30 MED ORDER — ALPRAZOLAM 0.5 MG PO TABS: 0.5000 mg | ORAL_TABLET | Freq: Three times a day (TID) | ORAL | 0 refills | Status: AC | PRN

## 2019-01-30 NOTE — Progress Notes (Signed)
Rx RF xanax. Pt uses sparingly.

## 2019-02-14 ENCOUNTER — Other Ambulatory Visit: Payer: Self-pay

## 2019-02-14 ENCOUNTER — Ambulatory Visit: Payer: 59 | Admitting: Internal Medicine

## 2019-02-14 ENCOUNTER — Inpatient Hospital Stay: Payer: 59 | Attending: Internal Medicine

## 2019-02-14 ENCOUNTER — Inpatient Hospital Stay: Payer: 59

## 2019-02-14 DIAGNOSIS — D500 Iron deficiency anemia secondary to blood loss (chronic): Secondary | ICD-10-CM | POA: Diagnosis present

## 2019-02-14 DIAGNOSIS — N920 Excessive and frequent menstruation with regular cycle: Secondary | ICD-10-CM | POA: Insufficient documentation

## 2019-02-14 LAB — FERRITIN: Ferritin: 68 ng/mL (ref 11–307)

## 2019-02-14 LAB — CBC WITH DIFFERENTIAL/PLATELET
Abs Immature Granulocytes: 0.02 10*3/uL (ref 0.00–0.07)
Basophils Absolute: 0 10*3/uL (ref 0.0–0.1)
Basophils Relative: 0 %
Eosinophils Absolute: 0.2 10*3/uL (ref 0.0–0.5)
Eosinophils Relative: 3 %
HCT: 40.7 % (ref 36.0–46.0)
Hemoglobin: 13.9 g/dL (ref 12.0–15.0)
Immature Granulocytes: 0 %
Lymphocytes Relative: 30 %
Lymphs Abs: 2.3 10*3/uL (ref 0.7–4.0)
MCH: 29.7 pg (ref 26.0–34.0)
MCHC: 34.2 g/dL (ref 30.0–36.0)
MCV: 87 fL (ref 80.0–100.0)
Monocytes Absolute: 0.4 10*3/uL (ref 0.1–1.0)
Monocytes Relative: 5 %
Neutro Abs: 4.8 10*3/uL (ref 1.7–7.7)
Neutrophils Relative %: 62 %
Platelets: 244 10*3/uL (ref 150–400)
RBC: 4.68 MIL/uL (ref 3.87–5.11)
RDW: 14.6 % (ref 11.5–15.5)
WBC: 7.7 10*3/uL (ref 4.0–10.5)
nRBC: 0 % (ref 0.0–0.2)

## 2019-02-14 LAB — IRON AND TIBC
Iron: 68 ug/dL (ref 28–170)
Saturation Ratios: 23 % (ref 10.4–31.8)
TIBC: 301 ug/dL (ref 250–450)
UIBC: 233 ug/dL

## 2019-02-14 NOTE — Progress Notes (Signed)
RN reviewed labs with MD. Per MD pt does not need Feraheme transfusion today 02/14/2019 and that MD will review labs with pt during virtual visit on 02/15/2019. Pt updated and all questions answered at this time.   Shelly Underwood [employer]

## 2019-02-15 ENCOUNTER — Inpatient Hospital Stay (HOSPITAL_BASED_OUTPATIENT_CLINIC_OR_DEPARTMENT_OTHER): Payer: 59 | Admitting: Internal Medicine

## 2019-02-15 DIAGNOSIS — N920 Excessive and frequent menstruation with regular cycle: Secondary | ICD-10-CM

## 2019-02-15 DIAGNOSIS — Z79899 Other long term (current) drug therapy: Secondary | ICD-10-CM | POA: Diagnosis not present

## 2019-02-15 DIAGNOSIS — D500 Iron deficiency anemia secondary to blood loss (chronic): Secondary | ICD-10-CM

## 2019-02-15 NOTE — Progress Notes (Signed)
I connected with [name] on 02/15/19 at  8:30 AM EDT by video enabled telemedicine visit and verified that I am speaking with the correct person using two identifiers.  I discussed the limitations, risks, security and privacy concerns of performing an evaluation and management service by telemedicine and the availability of in-person appointments. I also discussed with the patient that there may be a patient responsible charge related to this service. The patient expressed understanding and agreed to proceed.    Other persons participating in the visit and their role in the encounter: Nurse reconciliation medication Patient's location: Home Provider's location: Office   No history exists.     Chief Complaint: Iron deficient anemia   History of present illness:Shelly Underwood 58 y.o.  female with history of history of iron deficient anemia secondary to history of menorrhagia is here for follow-up.  Patient status post IV iron energy levels improved.  However fatigue not complete resolved.  States her menstrual periods are improved at this time.  They are not as severe as they used to be in the past.  Otherwise no blood in stools or black or stools.   Observation/objective:  Assessment and plan: Iron deficiency anemia due to chronic blood loss # iron deficiency Anemia: Likely secondary to heavy menstrual./Menorrhagia. Status post IV Feraheme hemoglobin today is 13; Iron sat-23%; ferritin-68. HOLD IV iron at this time. Continue PO iron/Vit supp/dietary supp.   #Etiology likely menorrhagia - s/p Gyn [[facility]];improved;   # DISPOSITION: # follow up 4 months- MD-Dox; labs-cbc/iron/ferritin-Dr.B    Follow-up instructions:  I discussed the assessment and treatment plan with the patient.  The patient was provided an opportunity to ask questions and all were answered.  The patient agreed with the plan and demonstrated understanding of instructions.  The patient was advised to  call back or seek an in person evaluation if the symptoms worsen or if the condition fails to improve as anticipated.    Dr. Charlaine Dalton [facility] 02/15/2019 5:26 PM

## 2019-02-15 NOTE — Assessment & Plan Note (Addendum)
#   iron deficiency Anemia: Likely secondary to heavy menstrual./Menorrhagia. Status post IV Feraheme hemoglobin today is 13; Iron sat-23%; ferritin-68. HOLD IV iron at this time. Continue PO iron/Vit supp/dietary supp.   #Etiology likely menorrhagia - s/p Gyn [[facility]];improved;   # DISPOSITION: # follow up 4 months- MD-Dox; labs-cbc/iron/ferritin-Dr.B

## 2019-04-22 ENCOUNTER — Telehealth: Payer: Self-pay

## 2019-04-22 ENCOUNTER — Encounter (INDEPENDENT_AMBULATORY_CARE_PROVIDER_SITE_OTHER): Payer: Self-pay

## 2019-04-22 ENCOUNTER — Telehealth: Payer: 59 | Admitting: Family

## 2019-04-22 DIAGNOSIS — Z20822 Contact with and (suspected) exposure to covid-19: Secondary | ICD-10-CM

## 2019-04-22 DIAGNOSIS — R6889 Other general symptoms and signs: Secondary | ICD-10-CM

## 2019-04-22 NOTE — Telephone Encounter (Signed)
Pt c/o weakness denies lightheadedness and are walking ok without assistance. Pt advised to call 911 if unable to stand or has to hold on to something to get balance. Order for testing placed.

## 2019-04-22 NOTE — Progress Notes (Signed)
E-Visit for Corona Virus Screening   Your current symptoms could be consistent with the coronavirus.  Many health care providers can now test patients at their office but not all are.  [facility] has multiple testing sites. For information on our COVID testing locations and hours go to [email or web address]  Please quarantine yourself while awaiting your test results.  We are enrolling you in our [place] for [place] . Daily you will receive a questionnaire within the [facility] website. Our COVID 19 response team willl be monitoriing your responses daily.  You can go to one of the  testing sites listed below, while they are opened (see hours). You do not need an order, just arrive to the testing site.  You do need to self isolate until your results return and if positive 14 days from when your symptoms started and until you are 3 days symptom free.   Testing Locations (Monday - Friday, 8 a.m. - 3:30 p.m.) . [place]: [facility] at [facility], [address] [place], [place]: [place], [place], [place], [place] (entrance off [address])  . [address], [place], [place] (across from [facility] Emergency Department)  Approximately 5 minutes was spent documenting and reviewing patient's chart.    COVID-19 is a respiratory illness with symptoms that are similar to the flu. Symptoms are typically mild to moderate, but there have been cases of severe illness and death due to the virus. The following symptoms may appear 2-14 days after exposure: . Fever . Cough . Shortness of breath or difficulty breathing . Chills . Repeated shaking with chills . Muscle pain . Headache . Sore throat . New loss of taste or smell . Fatigue . Congestion or runny nose . Nausea or vomiting . Diarrhea  It is vitally important that if you feel that you have an infection such as this  virus or any other virus that you stay home and away from places where you may spread it to others.  You should self-quarantine for 14 days if you have symptoms that could potentially be coronavirus or have been in close contact a with a person diagnosed with COVID-19 within the last 2 weeks. You should avoid contact with people age 71 and older.   You should wear a mask or cloth face covering over your nose and mouth if you must be around other people or animals, including pets (even at home). Try to stay at least 6 feet away from other people. This will protect the people around you.    You may also take acetaminophen (Tylenol) as needed for fever.   Reduce your risk of any infection by using the same precautions used for avoiding the common cold or flu:  Marland Kitchen Wash your hands often with soap and warm water for at least 20 seconds.  If soap and water are not readily available, use an alcohol-based hand sanitizer with at least 60% alcohol.  . If coughing or sneezing, cover your mouth and nose by coughing or sneezing into the elbow areas of your shirt or coat, into a tissue or into your sleeve (not your hands). . Avoid shaking hands with others and consider head nods or verbal greetings only. . Avoid touching your eyes, nose, or mouth with unwashed hands.  . Avoid close contact with people who are sick. . Avoid places or events with large numbers of people in one location, like concerts or  sporting events. . Carefully consider travel plans you have or are making. . If you are planning any travel outside or inside the [place], visit the CDC's Travelers' Health webpage for the latest health notices. . If you have some symptoms but not all symptoms, continue to monitor at home and seek medical attention if your symptoms worsen. . If you are having a medical emergency, call 911.  HOME CARE . Only take medications as instructed by your medical team. . Drink plenty of fluids and get plenty of rest. . A  steam or ultrasonic humidifier can help if you have congestion.   GET HELP RIGHT AWAY IF YOU HAVE EMERGENCY WARNING SIGNS** FOR COVID-19. If you or someone is showing any of these signs seek emergency medical care immediately. Call 911 or proceed to your closest emergency facility if: . You develop worsening high fever. . Trouble breathing . Bluish lips or face . Persistent pain or pressure in the chest . New confusion . Inability to wake or stay awake . You cough up blood. . Your symptoms become more severe  **This list is not all possible symptoms. Contact your medical provider for any symptoms that are sever or concerning to you.   MAKE SURE YOU   Understand these instructions.  Will watch your condition.  Will get help right away if you are not doing well or get worse.  Your e-visit answers were reviewed by a board certified advanced clinical practitioner to complete your personal care plan.  Depending on the condition, your plan could have included both over the counter or prescription medications.  If there is a problem please reply once you have received a response from your provider.  Your safety is important to [place].  If you have drug allergies check your prescription carefully.    You can use MyChart to ask questions about today's visit, request a non-urgent call back, or ask for a work or school excuse for 24 hours related to this e-Visit. If it has been greater than 24 hours you will need to follow up with your provider, or enter a new e-Visit to address those concerns. You will get an e-mail in the next two days asking about your experience.  I hope that your e-visit has been valuable and will speed your recovery. Thank you for using e-visits.

## 2019-04-23 ENCOUNTER — Encounter (INDEPENDENT_AMBULATORY_CARE_PROVIDER_SITE_OTHER): Payer: Self-pay

## 2019-04-23 ENCOUNTER — Other Ambulatory Visit: Payer: Self-pay

## 2019-04-23 DIAGNOSIS — Z20822 Contact with and (suspected) exposure to covid-19: Secondary | ICD-10-CM

## 2019-04-24 ENCOUNTER — Encounter (INDEPENDENT_AMBULATORY_CARE_PROVIDER_SITE_OTHER): Payer: Self-pay

## 2019-04-24 ENCOUNTER — Telehealth: Payer: Self-pay

## 2019-04-24 LAB — NOVEL CORONAVIRUS, NAA: SARS-CoV-2, NAA: NOT DETECTED

## 2019-04-24 NOTE — Telephone Encounter (Signed)
PEC Courtesy call -   Patient reports cough - dry, hacking and low grade temp - 99.2. Reviewed MyChart protocol with patient - advised to contact insurance company for PCPs in her area - no further questions.

## 2019-04-25 ENCOUNTER — Encounter (INDEPENDENT_AMBULATORY_CARE_PROVIDER_SITE_OTHER): Payer: Self-pay

## 2019-04-25 ENCOUNTER — Telehealth: Payer: Self-pay

## 2019-04-25 NOTE — Telephone Encounter (Signed)
[place] Call regarding COVID19 Questionnaire response -   Patient reports cough - worse.Shelly KitchenMarland Kitchenpurchased OTC Mucinex DM  - took first dose about 7:30 this evening and is seeing a difference. Patient reports taking this medication in the past for her allergies with relief.   Patient advise to discuss continuation of COVID19 questionnaire or not with her PCP. Patient stated she understood and would.

## 2019-04-26 ENCOUNTER — Encounter (INDEPENDENT_AMBULATORY_CARE_PROVIDER_SITE_OTHER): Payer: Self-pay

## 2019-04-27 ENCOUNTER — Emergency Department
Admission: EM | Admit: 2019-04-27 | Discharge: 2019-04-27 | Disposition: A | Discharge status: Home / Self Care | Payer: 59 | Attending: Emergency Medicine | Admitting: Emergency Medicine

## 2019-04-27 ENCOUNTER — Emergency Department: Payer: 59

## 2019-04-27 ENCOUNTER — Other Ambulatory Visit: Payer: Self-pay

## 2019-04-27 DIAGNOSIS — R05 Cough: Secondary | ICD-10-CM | POA: Diagnosis not present

## 2019-04-27 DIAGNOSIS — Z87891 Personal history of nicotine dependence: Secondary | ICD-10-CM | POA: Diagnosis not present

## 2019-04-27 DIAGNOSIS — R059 Cough, unspecified: Secondary | ICD-10-CM

## 2019-04-27 LAB — BASIC METABOLIC PANEL
Anion gap: 13 (ref 5–15)
BUN: 13 mg/dL (ref 6–20)
CO2: 17 mmol/L — ABNORMAL LOW (ref 22–32)
Calcium: 9 mg/dL (ref 8.9–10.3)
Chloride: 107 mmol/L (ref 98–111)
Creatinine, Ser: 0.79 mg/dL (ref 0.44–1.00)
GFR calc Af Amer: >60 mL/min (ref >60)
GFR calc non Af Amer: >60 mL/min (ref >60)
Glucose, Bld: 116 mg/dL — ABNORMAL HIGH (ref 70–99)
Potassium: 3.4 mmol/L — ABNORMAL LOW (ref 3.5–5.1)
Sodium: 137 mmol/L (ref 135–145)

## 2019-04-27 LAB — URINALYSIS, COMPLETE (UACMP) WITH MICROSCOPIC
Bilirubin Urine: NEGATIVE
Glucose, UA: NEGATIVE mg/dL
Hgb urine dipstick: NEGATIVE
Ketones, ur: 5 mg/dL — AB
Leukocytes,Ua: NEGATIVE
Nitrite: NEGATIVE
Protein, ur: NEGATIVE mg/dL
Specific Gravity, Urine: 1.002 — ABNORMAL LOW (ref 1.005–1.030)
pH: 6 (ref 5.0–8.0)

## 2019-04-27 LAB — CBC
HCT: 43.8 % (ref 36.0–46.0)
Hemoglobin: 15 g/dL (ref 12.0–15.0)
MCH: 29.5 pg (ref 26.0–34.0)
MCHC: 34.2 g/dL (ref 30.0–36.0)
MCV: 86.2 fL (ref 80.0–100.0)
Platelets: 280 10*3/uL (ref 150–400)
RBC: 5.08 MIL/uL (ref 3.87–5.11)
RDW: 12.4 % (ref 11.5–15.5)
WBC: 7.5 10*3/uL (ref 4.0–10.5)
nRBC: 0 % (ref 0.0–0.2)

## 2019-04-27 MED ORDER — BENZONATATE 100 MG PO CAPS: 100.0000 mg | ORAL_CAPSULE | Freq: Four times a day (QID) | ORAL | 0 refills | Status: DC | PRN

## 2019-04-27 MED ORDER — ALBUTEROL SULFATE HFA 108 (90 BASE) MCG/ACT IN AERS: 2.0000 | INHALATION_SPRAY | Freq: Four times a day (QID) | RESPIRATORY_TRACT | 0 refills | Status: AC | PRN

## 2019-04-27 MED ORDER — GUAIFENESIN-CODEINE 100-10 MG/5ML PO SOLN: 5.0000 mL | Freq: Four times a day (QID) | ORAL | 0 refills | Status: AC | PRN

## 2019-04-27 NOTE — ED Provider Notes (Signed)
[facility] Emergency Department Provider Note  ____________________________________________   I have reviewed the triage vital signs and the nursing notes.   HISTORY  Chief Complaint Cough  History limited by: Not Limited   HPI Shelly Underwood is a 58 y.o. female who presents to the emergency department today because of concerns for persistent cough.  Patient states she first developed a cough roughly 1 week ago.  She has been trying over-the-counter medications without any significant relief.  She states that she feels like she has phlegm that she has a hard time bringing up.  She feels it is in the center of her chest. She then had a fit of coughing today that caused some dizziness and numbness in her hands.  This lasted roughly 5 minutes. Was tested for COVID earlier this week which was negative.  She is also been running low-grade fevers.  Patient denies any smoking.  Denies any known sick contacts.   Records reviewed. Per medical record review patient has a history of panic disorder.  Past Medical History:  Diagnosis Date  . Anxiety   . History of mammogram 08/25/2012; 01/13/14   cat 1; neg  . History of Papanicolaou smear of cervix 03/2010;01/04/2014   -/-; -/-  . Impaired glucose tolerance   . Leiomyoma 2018   submucosal and pedunculated  . Obesity   . Panic disorder   . Skin disorder     Patient Active Problem List   Diagnosis Date Noted  . Anemia due to acute blood loss 09/29/2018  . Abnormal uterine bleeding 09/29/2018  . Iron deficiency anemia due to chronic blood loss 09/15/2018  . Perimenopause 07/27/2017  . Dysfunctional uterine bleeding 03/01/2017  . ASCUS of cervix with negative high risk HPV 03/01/2017    Past Surgical History:  Procedure Laterality Date  . CHOLECYSTECTOMY    . LIPOMA EXCISION    . TONSILLECTOMY      Prior to Admission medications   Medication Sig Start Date End Date Taking? Authorizing Provider  albuterol  (PROVENTIL HFA;VENTOLIN HFA) 108 (90 Base) MCG/ACT inhaler Inhale 1 puff into the lungs every 6 (six) hours as needed for wheezing or shortness of breath.  02/06/15   [provider]  ALPRAZolam Duanne Moron) 0.5 MG tablet Take 1 tablet (0.5 mg total) by mouth 3 (three) times daily as needed. for anxiety 2/95/28   Copland, Alicia B, PA-C  Ascorbic Acid POWD Take 1 tablet by mouth daily.     [provider]  Carbonyl Iron 18 MG CHEW Chew 1 tablet by mouth daily.     [provider]  Cetirizine HCl 10 MG CAPS Take 1 capsule by mouth daily.     [provider]  famotidine (PEPCID) 10 MG tablet Take 10 mg by mouth daily.     [provider]  Melatonin 5 MG CAPS Take 1 capsule by mouth daily.     [provider]    Allergies Erythromycin and Penicillins  Family History  Problem Relation Age of Onset  . Hyperlipidemia Mother   . Hypertension Maternal Grandmother   . Hypertension Maternal Grandfather   . Breast cancer Maternal Aunt 62    Social History Social History   Tobacco Use  . Smoking status: Former Research scientist (life sciences)  . Smokeless tobacco: Never Used  Substance Use Topics  . Alcohol use: Yes    Comment: occ  . Drug use: No    Review of Systems Constitutional: No fever/chills Eyes: No visual changes. ENT:  No sore throat. Cardiovascular: Denies chest pain. Respiratory: Positive for cough. Gastrointestinal: No abdominal pain.  No nausea, no vomiting.  No diarrhea.   Genitourinary: Negative for dysuria. Musculoskeletal: Negative for back pain. Skin: Negative for rash. Neurological: Dizziness, numbness in the hands ____________________________________________   PHYSICAL EXAM:  VITAL SIGNS: ED Triage Vitals  Enc Vitals Group     BP 04/27/19 1305 (!) 147/71     Pulse Rate 04/27/19 1305 78     Resp 04/27/19 1305 20     Temp 04/27/19 1305 98.5 F (36.9 C)     Temp Source 04/27/19 1305 Oral     SpO2 04/27/19 1305 98 %     Weight  04/27/19 1306 215 lb (97.5 kg)     Height 04/27/19 1306 5\' 6"  (1.676 m)     Head Circumference --      Peak Flow --      Pain Score 04/27/19 1305 0   Constitutional: Alert and oriented.  Eyes: Conjunctivae are normal.  ENT      Head: Normocephalic and atraumatic.      Nose: No congestion/rhinnorhea.      Mouth/Throat: Mucous membranes are moist.      Neck: No stridor. Hematological/Lymphatic/Immunilogical: No cervical lymphadenopathy. Cardiovascular: Normal rate, regular rhythm.  No murmurs, rubs, or gallops.  Respiratory: Normal respiratory effort without tachypnea nor retractions. Breath sounds are clear and equal bilaterally. No wheezes/rales/rhonchi. Occasional dry cough. Gastrointestinal: Soft and non tender. No rebound. No guarding.  Genitourinary: Deferred Musculoskeletal: Normal range of motion in all extremities. No lower extremity edema. Neurologic:  Normal speech and language. No gross focal neurologic deficits are appreciated.  Skin:  Skin is warm, dry and intact. No rash noted. Psychiatric: Mood and affect are normal. Speech and behavior are normal. Patient exhibits appropriate insight and judgment.  ____________________________________________    LABS (pertinent positives/negatives)  CBC wbc 7.5, hgb 15.0, plt 280 BMP wnl except k 3.4, co2 17, glu 116 UA clear, ketones 5, 0-5 rbc and wbc, rare bacteria ____________________________________________   EKG  I, Nance Pear, attending physician, personally viewed and interpreted this EKG  EKG Time: 1308 Rate: 71 Rhythm: normal sinus rhythm Axis: normal Intervals: qtc 454 QRS: narrow ST changes: no st elevation Impression: normal ekg ____________________________________________    RADIOLOGY  CXR No active disease  ____________________________________________   PROCEDURES  Procedures  ____________________________________________   INITIAL IMPRESSION / ASSESSMENT AND PLAN / ED  COURSE  Pertinent labs & imaging results that were available during my care of the patient were reviewed by me and considered in my medical decision making (see chart for details).   Patient presented to the emergency department today because of concerns for persistent cough.  On exam patient did have occasional dry cough however no wheezing or crackles appreciated in the lungs.  Chest x-ray did not show any pneumonia.  Blood work without concerning leukocytosis.  This point do wonder if patient is suffering from viral upper respiratory tract infection.  Patient was COVID negative.  Discussed URI with patient.  Will give patient prescription for cough medication and albuterol inhaler.  ____________________________________________   FINAL CLINICAL IMPRESSION(S) / ED DIAGNOSES  Final diagnoses:  Cough     Note: This dictation was prepared with Dragon dictation. Any transcriptional errors that result from this process are unintentional     Nance Pear, MD 04/27/19 1744

## 2019-04-27 NOTE — ED Notes (Signed)
Pt changing into gown. Will apply monitor once changed.

## 2019-04-27 NOTE — ED Notes (Signed)
Pt continues to rest calmly in bed. Call bell within reach. Rail up. Bed locked low.

## 2019-04-27 NOTE — ED Notes (Signed)
Pt has history of panic attacks. Today pt c/o episode of coughing, numbness/tingling in extremities, trembling, and dizziness. States it lasted for about 5 minutes and since then comes and goes. Pt states she had coffee this morning but no food yet. Denies nausea. Denies issues with blood sugar. States temp 99-100.6 at home over last few days. Pt covid tested Monday and states it came back negative.

## 2019-04-27 NOTE — Discharge Instructions (Addendum)
Please seek medical attention for any high fevers, chest pain, shortness of breath, change in behavior, persistent vomiting, bloody stool or any other new or concerning symptoms.  

## 2019-04-27 NOTE — ED Triage Notes (Signed)
Reports recurrent cough over last week, tested COVID negative, working from home today when had sudden onset of dizziness, tachycardia for about 5 minutes. Hx of panic attacks, reports that these sx felt different. endorsees current intermittent dizziness that continues.  Pt alert and oriented X4, active, cooperative, pt in NAD. RR even and unlabored, color WNL.

## 2019-04-27 NOTE — ED Triage Notes (Signed)
First Nurse Note:  Patient presents to the ED with severe cough.  Patient was tested for Covid19 on Monday and states her test was negative.  Patient states she is not feeling better and cough is worse.

## 2019-04-27 NOTE — ED Notes (Signed)
Pt requesting to have a liquid cough med if possible. States she has hard time taking pills/capsules. Will notify EDP Archie Balboa.

## 2019-05-01 ENCOUNTER — Encounter (INDEPENDENT_AMBULATORY_CARE_PROVIDER_SITE_OTHER): Payer: Self-pay

## 2019-06-18 ENCOUNTER — Ambulatory Visit: Payer: 59 | Admitting: Internal Medicine

## 2019-06-18 ENCOUNTER — Other Ambulatory Visit: Payer: 59

## 2019-06-28 ENCOUNTER — Encounter: Payer: Self-pay | Admitting: Internal Medicine

## 2019-06-28 ENCOUNTER — Other Ambulatory Visit: Payer: Self-pay

## 2019-06-28 DIAGNOSIS — D500 Iron deficiency anemia secondary to blood loss (chronic): Secondary | ICD-10-CM

## 2019-06-29 ENCOUNTER — Inpatient Hospital Stay: Payer: 59 | Attending: Internal Medicine

## 2019-06-29 ENCOUNTER — Other Ambulatory Visit: Payer: Self-pay

## 2019-06-29 ENCOUNTER — Inpatient Hospital Stay (HOSPITAL_BASED_OUTPATIENT_CLINIC_OR_DEPARTMENT_OTHER): Payer: 59 | Admitting: Internal Medicine

## 2019-06-29 DIAGNOSIS — D500 Iron deficiency anemia secondary to blood loss (chronic): Secondary | ICD-10-CM | POA: Diagnosis not present

## 2019-06-29 DIAGNOSIS — D509 Iron deficiency anemia, unspecified: Secondary | ICD-10-CM | POA: Insufficient documentation

## 2019-06-29 LAB — IRON AND TIBC
Iron: 48 ug/dL (ref 28–170)
Saturation Ratios: 13 % (ref 10.4–31.8)
TIBC: 385 ug/dL (ref 250–450)
UIBC: 337 ug/dL

## 2019-06-29 LAB — FERRITIN: Ferritin: 13 ng/mL (ref 11–307)

## 2019-06-29 LAB — CBC WITH DIFFERENTIAL/PLATELET
Abs Immature Granulocytes: 0.01 10*3/uL (ref 0.00–0.07)
Basophils Absolute: 0 10*3/uL (ref 0.0–0.1)
Basophils Relative: 0 %
Eosinophils Absolute: 0.3 10*3/uL (ref 0.0–0.5)
Eosinophils Relative: 5 %
HCT: 42.6 % (ref 36.0–46.0)
Hemoglobin: 13.6 g/dL (ref 12.0–15.0)
Immature Granulocytes: 0 %
Lymphocytes Relative: 37 %
Lymphs Abs: 2.3 10*3/uL (ref 0.7–4.0)
MCH: 28.2 pg (ref 26.0–34.0)
MCHC: 31.9 g/dL (ref 30.0–36.0)
MCV: 88.4 fL (ref 80.0–100.0)
Monocytes Absolute: 0.4 10*3/uL (ref 0.1–1.0)
Monocytes Relative: 6 %
Neutro Abs: 3.2 10*3/uL (ref 1.7–7.7)
Neutrophils Relative %: 52 %
Platelets: 250 10*3/uL (ref 150–400)
RBC: 4.82 MIL/uL (ref 3.87–5.11)
RDW: 12.4 % (ref 11.5–15.5)
WBC: 6.3 10*3/uL (ref 4.0–10.5)
nRBC: 0 % (ref 0.0–0.2)

## 2019-06-29 NOTE — Assessment & Plan Note (Addendum)
#   iron deficiency Anemia: Likely secondary to heavy menstrual./Menorrhagia.  Currently on p.o. iron.  October 2020- iron studies- trending down.  But patient is not overtly symptomatic.  Hold IV iron  # skin rash 2 years ago s/p dermatology; unlikely related to iron deficiency.  Follow-up with PCP/dermatology  #Etiology likely menorrhagia - s/p Gyn [west side] stable  # DISPOSITION: # follow up mid feb months- MD-Dox;1-2 days prior;  labs-cbc/iron/ferritin-Dr.B

## 2019-06-29 NOTE — Progress Notes (Signed)
I connected with Shelly Underwood on 06/29/2019 at  2:30 PM EDT by video enabled telemedicine visit and verified that I am speaking with the correct person using two identifiers.  I discussed the limitations, risks, security and privacy concerns of performing an evaluation and management service by telemedicine and the availability of in-person appointments. I also discussed with the patient that there may be a patient responsible charge related to this service. The patient expressed understanding and agreed to proceed.    Other persons participating in the visit and their role in the encounter: RN/medical reconciliation Patient's location: Home Provider's location: Office  Oncology History   No history exists.     Chief Complaint: Iron deficiency anemia   History of present illness:Shelly Underwood 58 y.o.  female with history of iron deficiency anemia likely secondary to heavy menstrual periods is here for follow-up.  Patient feels her energy levels are adequate.  However she is concerned about a skin rash on her extremities.  She states to have a prior history of skin rash which coincided with her prior diagnosis of iron deficiency.  She has been previously evaluated by dermatology about 2 years ago.  Observation/objective:  Assessment and plan: Iron deficiency anemia due to chronic blood loss # iron deficiency Anemia: Likely secondary to heavy menstrual./Menorrhagia.  Currently on p.o. iron.  October 2020- iron studies- trending down.  But patient is not overtly symptomatic.  Hold IV iron  # skin rash 2 years ago s/p dermatology; unlikely related to iron deficiency.  Follow-up with PCP/dermatology  #Etiology likely menorrhagia - s/p Gyn [west side] stable  # DISPOSITION: # follow up mid feb months- MD-Dox;1-2 days prior;  labs-cbc/iron/ferritin-Dr.B   Follow-up instructions:  I discussed the assessment and treatment plan with the patient.  The patient was provided an  opportunity to ask questions and all were answered.  The patient agreed with the plan and demonstrated understanding of instructions.  The patient was advised to call back or seek an in person evaluation if the symptoms worsen or if the condition fails to improve as anticipated.   Dr. Charlaine Underwood [facility] at [facility] 06/30/2019 6:18 AM

## 2019-10-30 ENCOUNTER — Other Ambulatory Visit: Payer: Self-pay

## 2019-10-31 ENCOUNTER — Inpatient Hospital Stay: Payer: No Typology Code available for payment source | Attending: Internal Medicine

## 2019-10-31 ENCOUNTER — Other Ambulatory Visit: Payer: Self-pay

## 2019-10-31 DIAGNOSIS — D500 Iron deficiency anemia secondary to blood loss (chronic): Secondary | ICD-10-CM | POA: Insufficient documentation

## 2019-10-31 DIAGNOSIS — N920 Excessive and frequent menstruation with regular cycle: Secondary | ICD-10-CM | POA: Insufficient documentation

## 2019-10-31 LAB — IRON AND TIBC
Iron: 44 ug/dL (ref 28–170)
Saturation Ratios: 12 % (ref 10.4–31.8)
TIBC: 358 ug/dL (ref 250–450)
UIBC: 314 ug/dL

## 2019-10-31 LAB — CBC WITH DIFFERENTIAL/PLATELET
Abs Immature Granulocytes: 0.02 10*3/uL (ref 0.00–0.07)
Basophils Absolute: 0 10*3/uL (ref 0.0–0.1)
Basophils Relative: 0 %
Eosinophils Absolute: 0.3 10*3/uL (ref 0.0–0.5)
Eosinophils Relative: 4 %
HCT: 43 % (ref 36.0–46.0)
Hemoglobin: 13.4 g/dL (ref 12.0–15.0)
Immature Granulocytes: 0 %
Lymphocytes Relative: 35 %
Lymphs Abs: 2.9 10*3/uL (ref 0.7–4.0)
MCH: 26.4 pg (ref 26.0–34.0)
MCHC: 31.2 g/dL (ref 30.0–36.0)
MCV: 84.8 fL (ref 80.0–100.0)
Monocytes Absolute: 0.5 10*3/uL (ref 0.1–1.0)
Monocytes Relative: 6 %
Neutro Abs: 4.5 10*3/uL (ref 1.7–7.7)
Neutrophils Relative %: 55 %
Platelets: 247 10*3/uL (ref 150–400)
RBC: 5.07 MIL/uL (ref 3.87–5.11)
RDW: 14.1 % (ref 11.5–15.5)
WBC: 8.2 10*3/uL (ref 4.0–10.5)
nRBC: 0 % (ref 0.0–0.2)

## 2019-10-31 LAB — FERRITIN: Ferritin: 22 ng/mL (ref 11–307)

## 2019-11-02 ENCOUNTER — Inpatient Hospital Stay (HOSPITAL_BASED_OUTPATIENT_CLINIC_OR_DEPARTMENT_OTHER): Payer: No Typology Code available for payment source | Admitting: Internal Medicine

## 2019-11-02 ENCOUNTER — Other Ambulatory Visit: Payer: Self-pay

## 2019-11-02 DIAGNOSIS — D500 Iron deficiency anemia secondary to blood loss (chronic): Secondary | ICD-10-CM | POA: Diagnosis not present

## 2019-11-02 NOTE — Progress Notes (Signed)
.  gb I connected with [name] on 11/02/2019 at  3:30 PM EST by video enabled telemedicine visit and verified that I am speaking with the correct person using two identifiers.  I discussed the limitations, risks, security and privacy concerns of performing an evaluation and management service by telemedicine and the availability of in-person appointments. I also discussed with the patient that there may be a patient responsible charge related to this service. The patient expressed understanding and agreed to proceed.    Other persons participating in the visit and their role in the encounter: RN/medical reconciliation Patient's location: home Provider's location: office  Oncology History   No history exists.   Chief Complaint: Anemia  History of present illness:Shelly Underwood 59 y.o.  female with history of anemia likely secondary to-menorrhagia is here for follow-up.  Patient denies any worsening fatigue.  She continues to been p.o. iron.  No significant nausea vomiting.  Her menstrual cycles are improved; continues to follow-up with GYN. Observation/objective:  Assessment and plan: Iron deficiency anemia due to chronic blood loss # iron deficiency Anemia: Likely secondary to heavy menstrual./Menorrhagia.  Currently on p.o. iron.  February 2021-hemoglobin 13; iron saturation 12%.  Hold off any IV iron at this time.  # skin rash 2 years ago s/p dermatology; unlikely related to iron deficiency.  Stable  #Etiology likely menorrhagia - s/p Gyn [west side stable  # DISPOSITION: # follow up in 6 months-MD-1-2 days prior;  labs-cbc/iron/ferritin-Dr.B    Follow-up instructions:  I discussed the assessment and treatment plan with the patient.  The patient was provided an opportunity to ask questions and all were answered.  The patient agreed with the plan and demonstrated understanding of instructions.  The patient was advised to call back or seek an in person evaluation if the  symptoms worsen or if the condition fails to improve as anticipated.  Dr. Charlaine [place] [facility] at [facility] 11/05/2019 8:00 AM

## 2019-11-02 NOTE — Assessment & Plan Note (Addendum)
#   iron deficiency Anemia: Likely secondary to heavy menstrual./Menorrhagia.  Currently on p.o. iron.  February 2021-hemoglobin 13; iron saturation 12%.  Hold off any IV iron at this time.  # skin rash 2 years ago s/p dermatology; unlikely related to iron deficiency.  Stable  #Etiology likely menorrhagia - s/p Gyn [west side stable  # DISPOSITION: # follow up in 6 months-MD-1-2 days prior;  labs-cbc/iron/ferritin-Dr.B

## 2019-12-14 ENCOUNTER — Ambulatory Visit: Payer: 59 | Attending: Internal Medicine

## 2019-12-14 DIAGNOSIS — Z23 Encounter for immunization: Secondary | ICD-10-CM

## 2019-12-14 NOTE — Progress Notes (Signed)
   Covid-19 Vaccination Clinic  Name:  Shelly Underwood    MRN: [id number] DOB: 1961/06/21  12/14/2019  [name] was observed post Covid-19 immunization for 15 minutes without incident. She was provided with Vaccine Information Sheet and instruction to access the V-Safe system.   [name] was instructed to call 911 with any severe reactions post vaccine: Marland Kitchen Difficulty breathing  . Swelling of face and throat  . A fast heartbeat  . A bad rash all over body  . Dizziness and weakness   Immunizations Administered    Name Date Dose VIS Date Route   Pfizer COVID-19 Vaccine 12/14/2019  8:21 AM 0.3 mL 08/24/2019 Intramuscular   Manufacturer: [place]   Lot: [id number]   [place]: [id number]

## 2020-01-09 ENCOUNTER — Ambulatory Visit: Payer: 59 | Attending: Internal Medicine

## 2020-01-09 DIAGNOSIS — Z23 Encounter for immunization: Secondary | ICD-10-CM

## 2020-01-09 NOTE — Progress Notes (Signed)
   Covid-19 Vaccination Clinic  Name:  AMELLE GUIRE    MRN: [id number] DOB: 1961-09-02  01/09/2020  Ms. Koy was observed post Covid-19 immunization for 15 minutes without incident. She was provided with Vaccine Information Sheet and instruction to access the V-Safe system.   Ms. Lain was instructed to call 911 with any severe reactions post vaccine: Marland Kitchen Difficulty breathing  . Swelling of face and throat  . A fast heartbeat  . A bad rash all over body  . Dizziness and weakness   Immunizations Administered    Name Date Dose VIS Date Route   Pfizer COVID-19 Vaccine 01/09/2020  8:20 AM 0.3 mL 11/07/2018 Intramuscular   Manufacturer: [place]   Lot: [id number]   [place]: [id number]

## 2020-04-30 ENCOUNTER — Other Ambulatory Visit: Payer: Self-pay

## 2020-04-30 ENCOUNTER — Inpatient Hospital Stay: Payer: No Typology Code available for payment source | Attending: Internal Medicine

## 2020-04-30 DIAGNOSIS — D500 Iron deficiency anemia secondary to blood loss (chronic): Secondary | ICD-10-CM

## 2020-04-30 DIAGNOSIS — D509 Iron deficiency anemia, unspecified: Secondary | ICD-10-CM | POA: Insufficient documentation

## 2020-04-30 DIAGNOSIS — N920 Excessive and frequent menstruation with regular cycle: Secondary | ICD-10-CM | POA: Diagnosis not present

## 2020-04-30 LAB — CBC WITH DIFFERENTIAL/PLATELET
Abs Immature Granulocytes: 0.02 10*3/uL (ref 0.00–0.07)
Basophils Absolute: 0 10*3/uL (ref 0.0–0.1)
Basophils Relative: 0 %
Eosinophils Absolute: 0.4 10*3/uL (ref 0.0–0.5)
Eosinophils Relative: 4 %
HCT: 43.9 % (ref 36.0–46.0)
Hemoglobin: 14.7 g/dL (ref 12.0–15.0)
Immature Granulocytes: 0 %
Lymphocytes Relative: 34 %
Lymphs Abs: 2.9 10*3/uL (ref 0.7–4.0)
MCH: 28.3 pg (ref 26.0–34.0)
MCHC: 33.5 g/dL (ref 30.0–36.0)
MCV: 84.6 fL (ref 80.0–100.0)
Monocytes Absolute: 0.4 10*3/uL (ref 0.1–1.0)
Monocytes Relative: 5 %
Neutro Abs: 4.8 10*3/uL (ref 1.7–7.7)
Neutrophils Relative %: 57 %
Platelets: 249 10*3/uL (ref 150–400)
RBC: 5.19 MIL/uL — ABNORMAL HIGH (ref 3.87–5.11)
RDW: 13.1 % (ref 11.5–15.5)
WBC: 8.6 10*3/uL (ref 4.0–10.5)
nRBC: 0 % (ref 0.0–0.2)

## 2020-04-30 LAB — IRON AND TIBC
Iron: 62 ug/dL (ref 28–170)
Saturation Ratios: 17 % (ref 10.4–31.8)
TIBC: 372 ug/dL (ref 250–450)
UIBC: 310 ug/dL

## 2020-04-30 LAB — FERRITIN: Ferritin: 42 ng/mL (ref 11–307)

## 2020-05-02 ENCOUNTER — Other Ambulatory Visit: Payer: Self-pay | Admitting: *Deleted

## 2020-05-02 ENCOUNTER — Inpatient Hospital Stay (HOSPITAL_BASED_OUTPATIENT_CLINIC_OR_DEPARTMENT_OTHER): Payer: No Typology Code available for payment source | Admitting: Internal Medicine

## 2020-05-02 DIAGNOSIS — D500 Iron deficiency anemia secondary to blood loss (chronic): Secondary | ICD-10-CM

## 2020-05-02 NOTE — Progress Notes (Signed)
.  gb I connected with [name] on 11/02/2019 at  2:30 PM EDT by video enabled telemedicine visit and verified that I am speaking with the correct person using two identifiers.  I discussed the limitations, risks, security and privacy concerns of performing an evaluation and management service by telemedicine and the availability of in-person appointments. I also discussed with the patient that there may be a patient responsible charge related to this service. The patient expressed understanding and agreed to proceed.    Other persons participating in the visit and their role in the encounter: RN/medical reconciliation Patient's location: home Provider's location: office  Oncology History   No history exists.   Chief Complaint: Anemia  History of present illness:Shelly Underwood 59 y.o.  female with history of anemia likely secondary to-menorrhagia is here for follow-up.  Patient is currently not experienced any heavy menstrual cycles.  She continues to follow with GYN.  Denies any worsening fatigue.  She continues to take p.o. iron although not very regularly.  Patient skin rash has spontaneous resolved.  Observation/objective:  Assessment and plan: Iron deficiency anemia due to chronic blood loss # iron deficiency Anemia: Likely secondary to heavy menstrual./Menorrhagia.  Currently on p.o. iron.  AUG 2021-hemoglobin 14.7; iron saturation 17%.  Hold off any IV iron at this time.  # skin rash 2 years ago s/p dermatology- spontaneously resolved;  Stable  #Etiology likely menorrhagia - s/p Gyn-improved; [west side]    # DISPOSITION: # follow up in 12 months-MD-1-2 days prior;  labs-cbc/iron/ferritin; possible [place]-Dr.B    Follow-up instructions:  I discussed the assessment and treatment plan with the patient.  The patient was provided an opportunity to ask questions and all were answered.  The patient agreed with the plan and demonstrated understanding of  instructions.  The patient was advised to call back or seek an in person evaluation if the symptoms worsen or if the condition fails to improve as anticipated.  Dr. Charlaine [place] at [facility] 05/02/2020 2:26 PM

## 2020-05-02 NOTE — Assessment & Plan Note (Addendum)
#   iron deficiency Anemia: Likely secondary to heavy menstrual./Menorrhagia.  Currently on p.o. iron.  AUG 2021-hemoglobin 14.7; iron saturation 17%.  Hold off any IV iron at this time.  # skin rash 2 years ago s/p dermatology- spontaneously resolved;  Stable  #Etiology likely menorrhagia - s/p Gyn-improved; [west side]    # DISPOSITION: # follow up in 12 months-MD-1-2 days prior;  labs-cbc/iron/ferritin; possible Ferrahem-Dr.B

## 2020-08-09 IMAGING — DX PORTABLE CHEST - 1 VIEW
1 series · 2 of 2 positions shown · non-contrast
Comparison: Radiographs July 30, 2018.

CLINICAL DATA: Cough.

EXAM:
PORTABLE CHEST 1 VIEW

[Series 1: chest ap · 0.14mm/px · 2 of 2 slices shown]
[im 1/2]
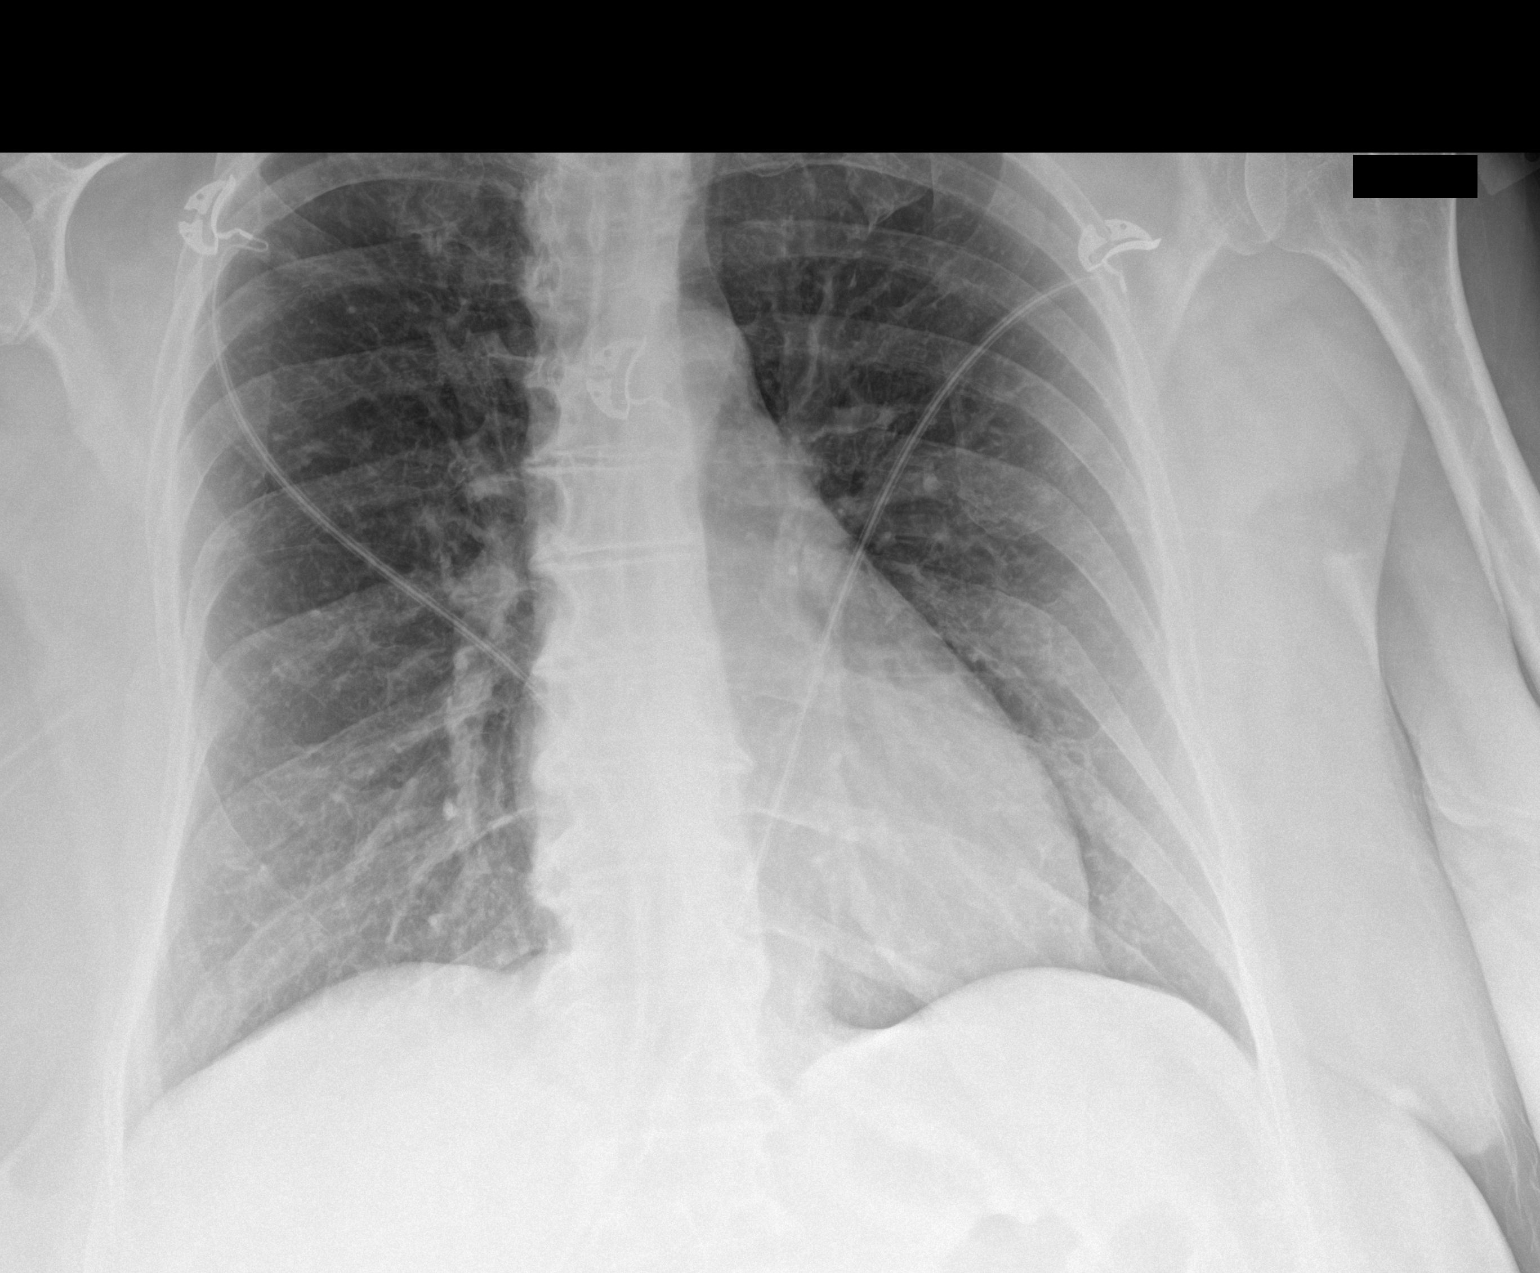
[im 2/2]
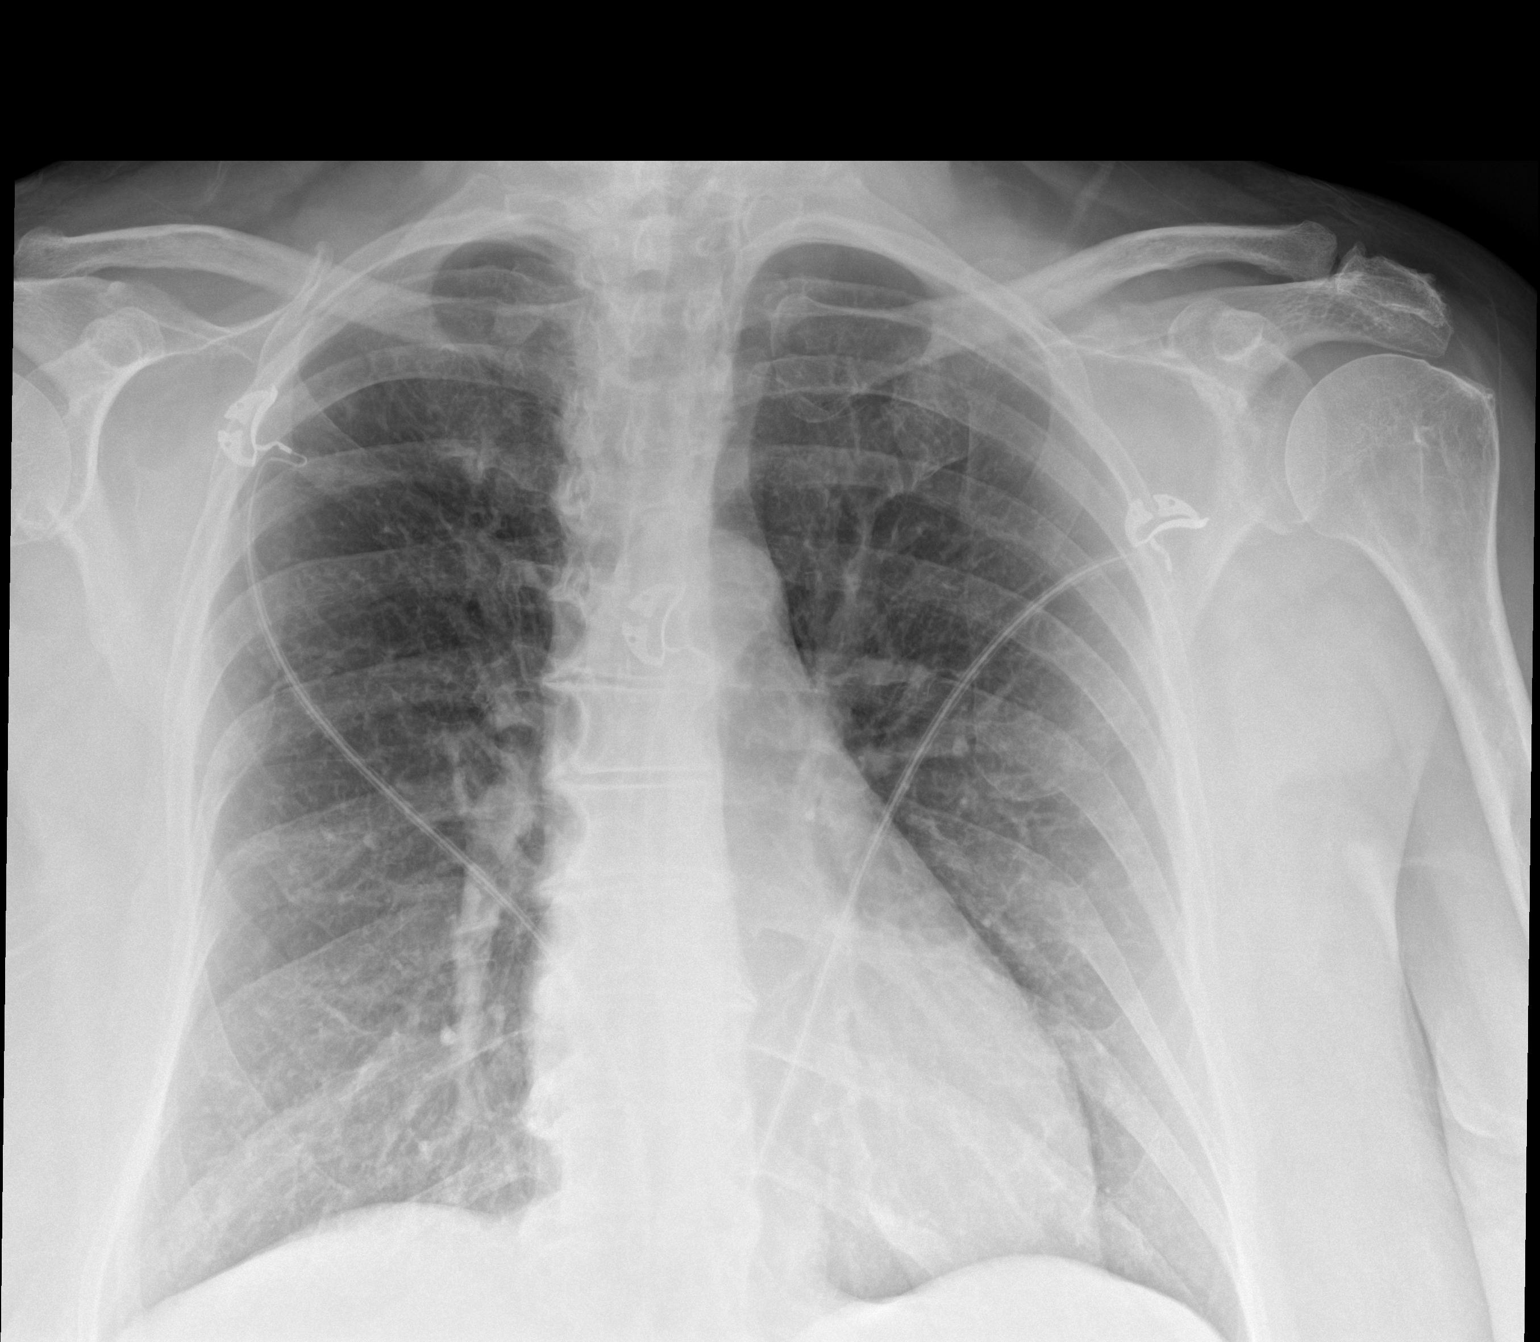

[2 of 2 positions shown; findings below may reference images not displayed]

FINDINGS: The heart size and mediastinal contours are within normal limits.
Both lungs are clear. The visualized skeletal structures are
unremarkable.
IMPRESSION: No active disease.

## 2021-04-21 ENCOUNTER — Encounter: Payer: Self-pay | Admitting: Internal Medicine

## 2021-04-28 ENCOUNTER — Telehealth: Payer: Self-pay | Admitting: Internal Medicine

## 2021-04-28 NOTE — Telephone Encounter (Signed)
Thanks so much. 

## 2021-04-28 NOTE — Telephone Encounter (Signed)
Patient called to cancel her upcoming appointments and stated she would  call back to reschedule.

## 2021-04-29 ENCOUNTER — Inpatient Hospital Stay: Payer: Self-pay

## 2021-05-01 ENCOUNTER — Inpatient Hospital Stay: Payer: Self-pay

## 2021-05-01 ENCOUNTER — Inpatient Hospital Stay: Payer: Self-pay | Admitting: Internal Medicine

## 2021-08-11 ENCOUNTER — Encounter (INDEPENDENT_AMBULATORY_CARE_PROVIDER_SITE_OTHER): Payer: Self-pay
